# Patient Record
Sex: Female | Born: 1990 | Race: Black or African American | Hispanic: No | Marital: Single
Health system: Midwestern US, Community
[De-identification: ages and names within clinical notes are randomized; demographics above are authoritative.]

## PROBLEM LIST (undated history)

## (undated) DIAGNOSIS — Z789 Other specified health status: Secondary | ICD-10-CM

## (undated) DIAGNOSIS — E079 Disorder of thyroid, unspecified: Secondary | ICD-10-CM

## (undated) DIAGNOSIS — F99 Mental disorder, not otherwise specified: Secondary | ICD-10-CM

## (undated) DIAGNOSIS — R1900 Intra-abdominal and pelvic swelling, mass and lump, unspecified site: Secondary | ICD-10-CM

## (undated) HISTORY — PX: EYE SURGERY: SHX253

---

## 2014-05-09 ENCOUNTER — Encounter (HOSPITAL_COMMUNITY): Payer: Self-pay | Admitting: *Deleted

## 2014-05-09 ENCOUNTER — Emergency Department (HOSPITAL_COMMUNITY)
Admission: EM | Admit: 2014-05-09 | Discharge: 2014-05-09 | Disposition: A | Discharge status: Home / Self Care | Payer: Medicaid Other | Attending: Emergency Medicine | Admitting: Emergency Medicine

## 2014-05-09 DIAGNOSIS — Z720 Tobacco use: Secondary | ICD-10-CM | POA: Diagnosis not present

## 2014-05-09 DIAGNOSIS — Z3202 Encounter for pregnancy test, result negative: Secondary | ICD-10-CM | POA: Diagnosis not present

## 2014-05-09 DIAGNOSIS — F1510 Other stimulant abuse, uncomplicated: Secondary | ICD-10-CM | POA: Diagnosis present

## 2014-05-09 DIAGNOSIS — F329 Major depressive disorder, single episode, unspecified: Secondary | ICD-10-CM | POA: Insufficient documentation

## 2014-05-09 DIAGNOSIS — Z79899 Other long term (current) drug therapy: Secondary | ICD-10-CM | POA: Diagnosis not present

## 2014-05-09 DIAGNOSIS — F1210 Cannabis abuse, uncomplicated: Secondary | ICD-10-CM | POA: Insufficient documentation

## 2014-05-09 DIAGNOSIS — F1410 Cocaine abuse, uncomplicated: Secondary | ICD-10-CM | POA: Insufficient documentation

## 2014-05-09 DIAGNOSIS — F32A Depression, unspecified: Secondary | ICD-10-CM

## 2014-05-09 LAB — URINE MICROSCOPIC-ADD ON

## 2014-05-09 LAB — RAPID URINE DRUG SCREEN, HOSP PERFORMED
Amphetamines: POSITIVE — AB
BARBITURATES: NOT DETECTED
Benzodiazepines: NOT DETECTED
Cocaine: POSITIVE — AB
Opiates: NOT DETECTED
Tetrahydrocannabinol: POSITIVE — AB

## 2014-05-09 LAB — URINALYSIS, ROUTINE W REFLEX MICROSCOPIC
Glucose, UA: NEGATIVE mg/dL
NITRITE: NEGATIVE
PROTEIN: 100 mg/dL — AB
Specific Gravity, Urine: 1.043 — ABNORMAL HIGH (ref 1.005–1.030)
UROBILINOGEN UA: 1 mg/dL (ref 0.0–1.0)
pH: 5.5 (ref 5.0–8.0)

## 2014-05-09 LAB — CBC WITH DIFFERENTIAL/PLATELET
BASOS ABS: 0 10*3/uL (ref 0.0–0.1)
Basophils Relative: 0 % (ref 0–1)
EOS PCT: 0 % (ref 0–5)
Eosinophils Absolute: 0 10*3/uL (ref 0.0–0.7)
HEMATOCRIT: 41.8 % (ref 36.0–46.0)
Hemoglobin: 13.7 g/dL (ref 12.0–15.0)
Lymphocytes Relative: 12 % (ref 12–46)
Lymphs Abs: 1.4 10*3/uL (ref 0.7–4.0)
MCH: 27.7 pg (ref 26.0–34.0)
MCHC: 32.8 g/dL (ref 30.0–36.0)
MCV: 84.4 fL (ref 78.0–100.0)
MONO ABS: 0.6 10*3/uL (ref 0.1–1.0)
Monocytes Relative: 5 % (ref 3–12)
Neutro Abs: 9.3 10*3/uL — ABNORMAL HIGH (ref 1.7–7.7)
Neutrophils Relative %: 83 % — ABNORMAL HIGH (ref 43–77)
Platelets: 302 10*3/uL (ref 150–400)
RBC: 4.95 MIL/uL (ref 3.87–5.11)
RDW: 13.6 % (ref 11.5–15.5)
WBC: 11.3 10*3/uL — ABNORMAL HIGH (ref 4.0–10.5)

## 2014-05-09 LAB — COMPREHENSIVE METABOLIC PANEL
ALT: 11 U/L (ref 0–35)
AST: 16 U/L (ref 0–37)
Albumin: 4.5 g/dL (ref 3.5–5.2)
Alkaline Phosphatase: 55 U/L (ref 39–117)
Anion gap: 14 (ref 5–15)
BUN: 13 mg/dL (ref 6–23)
CALCIUM: 9.3 mg/dL (ref 8.4–10.5)
CO2: 24 mEq/L (ref 19–32)
CREATININE: 0.9 mg/dL (ref 0.50–1.10)
Chloride: 99 mEq/L (ref 96–112)
GFR calc Af Amer: >90 mL/min (ref >90)
GFR, EST NON AFRICAN AMERICAN: 89 mL/min — AB (ref >90)
Glucose, Bld: 95 mg/dL (ref 70–99)
Potassium: 3.7 mEq/L (ref 3.7–5.3)
Sodium: 137 mEq/L (ref 137–147)
Total Bilirubin: 0.3 mg/dL (ref 0.3–1.2)
Total Protein: 8.4 g/dL — ABNORMAL HIGH (ref 6.0–8.3)

## 2014-05-09 LAB — LIPASE, BLOOD: LIPASE: 16 U/L (ref 11–59)

## 2014-05-09 LAB — PREGNANCY, URINE: Preg Test, Ur: NEGATIVE

## 2014-05-09 MED ORDER — LORAZEPAM 1 MG PO TABS
1.0000 mg | ORAL_TABLET | Freq: Once | ORAL | Status: AC
  Administered 2014-05-09: 1 mg via ORAL
  Filled 2014-05-09: qty 1

## 2014-05-09 MED ORDER — ALPRAZOLAM 0.5 MG PO TABS: 0.5000 mg | ORAL_TABLET | Freq: Two times a day (BID) | ORAL | Status: DC | PRN

## 2014-05-09 MED ORDER — GI COCKTAIL ~~LOC~~
30.0000 mL | Freq: Once | ORAL | Status: AC
  Administered 2014-05-09: 30 mL via ORAL
  Filled 2014-05-09: qty 30

## 2014-05-09 NOTE — Discharge Instructions (Signed)
°Emergency Department Resource Guide °1) Find a Doctor and Pay Out of Pocket °Although you won't have to find out who is covered by your insurance plan, it is a good idea to ask around and get recommendations. You will then need to call the office and see if the doctor you have chosen will accept you as a new patient and what types of options they offer for patients who are self-pay. Some doctors offer discounts or will set up payment plans for their patients who do not have insurance, but you will need to ask so you aren't surprised when you get to your appointment. ° °2) Contact Your Local Health Department °Not all health departments have doctors that can see patients for sick visits, but many do, so it is worth a call to see if yours does. If you don't know where your local health department is, you can check in your phone book. The CDC also has a tool to help you locate your state's health department, and many state websites also have listings of all of their local health departments. ° °3) Find a Walk-in Clinic °If your illness is not likely to be very severe or complicated, you may want to try a walk in clinic. These are popping up all over the country in pharmacies, drugstores, and shopping centers. They're usually staffed by nurse practitioners or physician assistants that have been trained to treat common illnesses and complaints. They're usually fairly quick and inexpensive. However, if you have serious medical issues or chronic medical problems, these are probably not your best option. ° °No Primary Care Doctor: °- Call [facility] at  832-8000 - they can help you locate a primary care doctor that  accepts your insurance, provides certain services, etc. °- Physician Referral Service- 1-800-533-3463 ° °Chronic Pain Problems: °Organization         Address  Phone   Notes  °[facility]  (336) 297-2271 Patients need to be referred by their primary care doctor.  ° °Medication  Assistance: °Organization         Address  Phone   Notes  °[place] [facility] [address] °[place], [place] [address] (336) 641-8030 --Must be a resident of [place] °-- Must have NO insurance coverage whatsoever (no Medicaid/ Medicare, etc.) °-- The pt. MUST have a primary care doctor that directs their care regularly and follows them in the community °  °[facility]  (866) 331-1348   °[employer]  (888) 892-1162   ° °Agencies that provide inexpensive medical care: °Organization         Address  Phone   Notes  °[facility]  (336) 832-8035   ° Internal Medicine    (336) 832-7272   °[facility] [address] °[place], [place] [address] (336) 832-4777   °[facility] [address], °[place] (336) 271-4999   °[facility]    (336) 373-0678   °[facility]    (336) 272-1050   °[facility] ° [address], [place] Phone:  (336) 832-4444, Fax:  (336) 832-4440 Hours of Operation:  9 am - 6 pm, M-F.  Also accepts Medicaid/Medicare and self-pay.  °[facility] ° [address], [place] Phone: (336) 832-3150, Fax: (336) 832-3151. Hours of Operation:  8:30 am - 5:30 pm, M-F.  Also accepts Medicaid and self-pay.  °[facility] [place] [address], [place] Phone: (336) 878-6027   °[facility] [address], [place], [place] (336)723-1848, Ext. 123 Mondays & Thursdays: 7-9 AM.  First 15 patients are seen on a first come, first serve basis. °  ° °Medicaid-accepting [place] Providers: ° °Organization         Address  Phone   Notes  °[facility] [address], [place] (336) 641-2100 Also accepts self-pay patients.  °[facility] [address], [place] ° (336) 856-9996   °[facility] [address], [place]  (336) 288-8857   °[facility] [address] [place] [address], [place] (336) 299-7000   °Veita Bland [address], [place]  ° (336) 373-1557 Only accepts [place] Medicaid patients after they have their name applied to their card.  ° °Self-Pay (no insurance) in [place]: ° °Organization         Address  Phone   Notes  °Sickle Cell Patients, [facility] [address], [place] (336) 832-1970   °[facility] [address], [place] (336) 832-4400   °[facility] ° [address], [place] (336) 992-4800   °[facility]/Dr. Osei-Bonsu ° [address] [place] [address], [place] or [address], [place] (336) 841-8500 Phone number for both [place] and [place] locations is the same.  °[facility] [address], [place] (336) 299-0000   °[facility] [address] [place] [address], [place] or [address] (336) 852-7530 °(336) 878-2260   °[facility] [address], [place] (336) 350-1642, phone; (336) 294-5005, fax Sees patients 1st and 3rd Saturday of every month.  Must not qualify for public or private insurance (i.e. Medicaid, Medicare, [place] Health Choice, Veterans' Benefits) • Household income should be no more than 200% of the poverty level •The clinic cannot treat you if you are pregnant or think you are pregnant • Sexually transmitted diseases are not treated at the clinic.  ° ° °Dental Care: °Organization         Address  Phone  Notes  °[place] [facility] [address], [place] (336) 641-6152 Accepts children up to age 21 who are enrolled in Medicaid or [place] Health Choice; pregnant women with a Medicaid card; and children who have applied for Medicaid or [place] Health Choice, but were declined, whose parents can pay a reduced fee at time of service.  °[place]  [facility] [place]  [address], [place] (336) 641-7733 Accepts children up to age 21 who are enrolled in Medicaid or [place] Health Choice; pregnant women with a Medicaid card; and children who have applied for Medicaid or  Health Choice, but were declined, whose parents can pay a reduced fee at time of service.  °[facility] ° [address], [place] (336) 641-4533 Patients are seen by appointment only. Walk-ins are not accepted. [facility] will see patients 18 years of age and older. °Monday - Tuesday (8am-5pm) °Most Wednesdays (8:30-5pm) °$30 per visit, cash only  °[facility] ° [address], [place] (336) 641-4533 Patients are seen by appointment only. Walk-ins are not accepted. [facility] will see patients 18 years of age and older. °One   Wednesday Evening (Monthly: Volunteer Based).  $30 per visit, cash only  °[facility]  (919) 537-3737 for adults; Children under age 4, call Graduate Pediatric Dentistry at (919) [phone]. Children aged 4-14, please call (919) [phone] to request a pediatric application. ° Dental services are provided in all areas of dental care including fillings, crowns and bridges, complete and partial dentures, implants, gum treatment, root canals, and extractions. Preventive care is also provided. Treatment is provided to both adults and children. °Patients are selected via a lottery and there is often a waiting list. °  °[facility] [address], °[place] ° (336) 763-8833 [email or web address] °  °[facility] [address], [place], [place] (336)723-1848, Ext. 123 Second and Fourth Thursday of each month, opens at 6:30 AM; Clinic ends at 9 AM.  Patients are seen on a first-come first-served basis, and a limited number are seen during each clinic.  ° °[facility] ° [address], [place], [place] (336) 723-7904    Eligibility Requirements °You must have lived in [place], [place], or [place] counties for at least the last three months. °  You cannot be eligible for state or federal sponsored healthcare insurance, including Veterans Administration, Medicaid, or Medicare. °  You generally cannot be eligible for healthcare insurance through your employer.  °  How to apply: °Eligibility screenings are held every Tuesday and Wednesday afternoon from 1:00 pm until 4:00 pm. You do not need an appointment for the interview!  °[facility] [address], [place], [place] 336-631-2330   °[facility]  336-342-8273   °[facility]  336-703-3100   °[facility]  336-570-6415   ° °Behavioral Health Resources in the Community: °Intensive Outpatient Programs °Organization         Address  Phone  Notes  °[facility] [address], [place], [place] 336-878-6098   °[facility] [address], [place], [place] 336-832-9800   °[facility] [address], [place], [place] ° 336-882-2125   °[facility] [address],  °[place], [place] 1-800-853-5163 or 336-641-4981   °Substance Abuse Resources °Organization         Address  Phone  Notes  °[facility]  336-882-2125   °[facility]  336-784-9470   °[facility]  336-285-9073   °[facility]  336-845-3988   °Residential & Outpatient Substance Abuse Program  1-800-659-3381   °Psychological Services °Organization         Address  Phone  Notes  °[facility]  336- 832-9600   °[facility]  336- 378-7881   °[facility] [address], [place] 1-800-853-5163 or 336-641-4981   ° °Mobile Crisis Teams °Organization         Address  Phone  Notes  °[facility], Mobile Crisis Care Unit  1-877-626-1772   °Assertive °[facility] ° [address]  [place], [place] 336-834-9664   °Sharon DeEsch [address] °[place]  336-554-5454   ° °Self-Help/Support Groups °Organization         Address  Phone             Notes  °[facility]  - variety of support groups  336- 373-1402 Call for more information  °Narcotics Anonymous (NA), [facility] [address], °[place]   2 meetings at this location  ° °  Residential Treatment Programs °Organization         Address  Phone  Notes  °[facility] [address],    °[place]  1-866-801-8205   °[facility] ° [address], [place], [place] 704-293-8524   °[facility] [address], [place] 336-845-3988 Admissions: 8am-3pm M-F  °[facility] [address]    °[place], [place] 336-841-1104   °[facility] [address], [place], [place] 336-379-7146   °[facility] [address]  °[place], [place] 336-285-9073   °[facility] - Intensive Outpatient [address], [place], [place] 336-852-3033   °[facility] [address]  °[place], [place] 1-877-615-2722 or 336-784-9470   °[facility] [address] [place], [place] 336-227-7417 Accepts Medicaid  °[facility] [address]  °[place] 1-800-659-3381 Substance Abuse/Addiction Treatment  ° °[place] Behavioral Health Resources °Organization         Address  Phone  Notes  °[facility]  (888) 581-9988   °Julie Brannon, PhD [address] [place], [place]   (336) 349-5553 or (336) 951-0000   °[facility]   [address] °[place], [place] (336) 349-4454   °[facility] [address], [place], [place] (336) 342-8316 Insurance/Medicaid/sponsorship through [facility]  °[facility] [address]                                    [place], [place] (336) 342-8316 Therapy/tele-psych/case    °[facility] [address]  ° [place], [place] (336) 349-2233    °Dr. Arfeen  (336) 349-4544   °[facility] [place]  [employer] [place] [facility] 1) [address], [place] °2) [address], [place] °3)  [address], [place] (336) 349-3220 °(336) 342-7768 ° °(336) 342-8140   °[place] [facility] (336) 342-1394 or (336) 342-3537 (After Hours)    ° ° °

## 2014-05-09 NOTE — ED Provider Notes (Signed)
CSN: [id number]     Arrival date & time 05/09/14  1656 History   First MD Initiated Contact with Patient 05/09/14 1756     Chief Complaint  Patient presents with  . Drug Problem     (Consider location/radiation/quality/duration/timing/severity/associated sxs/prior Treatment) HPI Comments: 23 year old female with a history of bipolar disorder, she reports that she has been out of her medications for approximately 3 years, she moved here 4 months ago and since then has found friends who she believes are bad influences on her and had her use cocaine several days ago. She reports that she felt particularly bad afterwards and a friend told her that it was actually crystal methamphetamine and not cocaine. She only used one time, it was greater than 72 hours ago, she has had no vomiting, diarrhea, headaches or fevers, she does report shortness of breath today, feeling "jittery", anxious and having a burning in her epigastric abdomen. She also reports a strong smell to her urine. She denies any other drug use, denies any depression or suicidal thoughts, and believes that she needs to be back on her medications due to her questionable choices including the recent drug use. She has no other history of drug use per her report.  Patient is a 23 y.o. female presenting with drug problem. The history is provided by the patient.  Drug Problem    History reviewed. No pertinent past medical history. History reviewed. No pertinent past surgical history. History reviewed. No pertinent family history. History  Substance Use Topics  . Smoking status: Current Every Day Smoker    Types: Cigarettes  . Smokeless tobacco: Not on file  . Alcohol Use: No   OB History    No data available     Review of Systems  All other systems reviewed and are negative.     Allergies  Review of patient's allergies indicates no known allergies.  Home Medications   Prior to Admission medications   Medication Sig Start  Date End Date Taking? Authorizing Provider  Acetaminophen-Caff-Pyrilamine (MIDOL COMPLETE PO) Take 2 tablets by mouth daily as needed (pain).   Yes Historical Provider, MD  ibuprofen (ADVIL,MOTRIN) 200 MG tablet Take 600 mg by mouth every 6 (six) hours as needed (pain).   Yes Historical Provider, MD  ALPRAZolam Prudy Feeler(XANAX) 0.5 MG tablet Take 1 tablet (0.5 mg total) by mouth 2 (two) times daily as needed for anxiety. 05/09/14   Vida RollerBrian D Etana Beets, MD   BP 120/59 mmHg  Pulse 75  Temp(Src) 98 F (36.7 C)  Resp 18  Ht 5\' 1"  (1.549 m)  Wt 154 lb (69.854 kg)  BMI 29.11 kg/m2  SpO2 100%  LMP 05/09/2014 Physical Exam  Constitutional: She appears well-developed and well-nourished. No distress.  HENT:  Head: Normocephalic and atraumatic.  Mouth/Throat: Oropharynx is clear and moist. No oropharyngeal exudate.  Eyes: Conjunctivae and EOM are normal. Pupils are equal, round, and reactive to light. Right eye exhibits no discharge. Left eye exhibits no discharge. No scleral icterus.  Neck: Normal range of motion. Neck supple. No JVD present. No thyromegaly present.  Cardiovascular: Normal rate, regular rhythm, normal heart sounds and intact distal pulses.  Exam reveals no gallop and no friction rub.   No murmur heard. Pulmonary/Chest: Effort normal and breath sounds normal. No respiratory distress. She has no wheezes. She has no rales.  Abdominal: Soft. Bowel sounds are normal. She exhibits no distension and no mass. There is no tenderness.  Musculoskeletal: Normal range of motion. She exhibits no  edema or tenderness.  Lymphadenopathy:    She has no cervical adenopathy.  Neurological: She is alert. Coordination normal.  Skin: Skin is warm and dry. No rash noted. No erythema.  Psychiatric: She has a normal mood and affect. Her behavior is normal.  No suicidal thoughts, flight of ideas, tangential thoughts, no pressured speech.  Nursing note and vitals reviewed.   ED Course  Procedures (including critical  care time) Labs Review Labs Reviewed  CBC WITH DIFFERENTIAL - Abnormal; Notable for the following:    WBC 11.3 (*)    Neutrophils Relative % 83 (*)    Neutro Abs 9.3 (*)    All other components within normal limits  COMPREHENSIVE METABOLIC PANEL - Abnormal; Notable for the following:    Total Protein 8.4 (*)    GFR calc non Af Amer 89 (*)    All other components within normal limits  URINALYSIS, ROUTINE W REFLEX MICROSCOPIC - Abnormal; Notable for the following:    Color, Urine RED (*)    APPearance CLOUDY (*)    Specific Gravity, Urine 1.043 (*)    Hgb urine dipstick LARGE (*)    Bilirubin Urine MODERATE (*)    Ketones, ur >80 (*)    Protein, ur 100 (*)    Leukocytes, UA SMALL (*)    All other components within normal limits  URINE RAPID DRUG SCREEN (HOSP PERFORMED) - Abnormal; Notable for the following:    Cocaine POSITIVE (*)    Amphetamines POSITIVE (*)    Tetrahydrocannabinol POSITIVE (*)    All other components within normal limits  URINE MICROSCOPIC-ADD ON - Abnormal; Notable for the following:    Squamous Epithelial / LPF FEW (*)    Bacteria, UA MANY (*)    All other components within normal limits  LIPASE, BLOOD  PREGNANCY, URINE    Imaging Review No results found.   EKG Interpretation   Date/Time:  Sunday May 09 2014 17:21:32 EST Ventricular Rate:  84 PR Interval:  114 QRS Duration: 72 QT Interval:  358 QTC Calculation: 423 R Axis:   77 Text Interpretation:  Sinus rhythm with marked sinus arrhythmia  Nonspecific ST abnormality Abnormal ECG No old tracing to compare  Confirmed by Deyon Chizek  MD, Cara Aguino ([id number]) on 05/09/2014 5:57:16 PM      MDM   Final diagnoses:  Depression  Methamphetamine use    The patient has no obvious tremor, her vital signs are normal, she has several vague complaints likely related to the anxiety of feeling like her mom is going to find out that she use drugs, will check psychiatric labs, give the patient follow-up  information for local doctors in the community, 1 mg of Ativan by mouth to help with the patient's significant anxiety.  Labs confirm likely meth use X 1 - VS normal - improved after ativan - endorses mild depression - wants f/u in community - Product/process development scientistmonarch and resource list given.  Meds given in ED:  Medications  gi cocktail (Maalox,Lidocaine,Donnatal) (30 mLs Oral Given 05/09/14 1836)  LORazepam (ATIVAN) tablet 1 mg (1 mg Oral Given 05/09/14 1836)    New Prescriptions   ALPRAZOLAM (XANAX) 0.5 MG TABLET    Take 1 tablet (0.5 mg total) by mouth 2 (two) times daily as needed for anxiety.      Vida RollerBrian D Laquisha Northcraft, MD 05/09/14 2012

## 2014-05-09 NOTE — ED Notes (Signed)
Pt ambulated to bathroom. Steady gait

## 2014-05-09 NOTE — ED Notes (Signed)
Pt reports that she thought she was doing cocaine 3 days ago and actually did crystal meth and has been feeling bad ever since. Has multiple complaints. Reports tingling sensation to body, pain to body, dizziness, headache and dry mouth.

## 2014-08-21 ENCOUNTER — Emergency Department (HOSPITAL_COMMUNITY)
Admission: EM | Admit: 2014-08-21 | Discharge: 2014-08-21 | Disposition: A | Discharge status: Home / Self Care | Payer: Medicaid Other | Attending: Emergency Medicine | Admitting: Emergency Medicine

## 2014-08-21 ENCOUNTER — Encounter (HOSPITAL_COMMUNITY): Payer: Self-pay | Admitting: *Deleted

## 2014-08-21 DIAGNOSIS — Z3202 Encounter for pregnancy test, result negative: Secondary | ICD-10-CM | POA: Diagnosis not present

## 2014-08-21 DIAGNOSIS — Z720 Tobacco use: Secondary | ICD-10-CM | POA: Insufficient documentation

## 2014-08-21 DIAGNOSIS — K529 Noninfective gastroenteritis and colitis, unspecified: Secondary | ICD-10-CM

## 2014-08-21 DIAGNOSIS — Z79899 Other long term (current) drug therapy: Secondary | ICD-10-CM | POA: Diagnosis not present

## 2014-08-21 DIAGNOSIS — Z3200 Encounter for pregnancy test, result unknown: Secondary | ICD-10-CM | POA: Diagnosis present

## 2014-08-21 NOTE — Discharge Instructions (Signed)
Your urine pregnancy test was negative today. However, as we discussed this does not exclude very early pregnancy. If you still have not had your period in 2 weeks, recommend repeat pregnancy test. Keep your follow-up appointment with you regular Dr. on Monday. Would not start birth control pills until you have your next period or cleared by your regular physician. For your symptoms of stomach virus, drinks bony of fluids. Gatorade and Powerade are good options. Avoid heavy or fried fatty foods through the weekend and consume a bland diet. Return for persistent vomiting with inability to keep down fluids, abdominal pain or new concerns.

## 2014-08-21 NOTE — ED Notes (Signed)
Pt here with her child.  Pt states "I've taken 2 tests already and they are negative."  She wants another test because she is nauseated and has not had her period in over a month.

## 2014-08-21 NOTE — ED Provider Notes (Signed)
CSN: [id number]     Arrival date & time 08/21/14  1018 History   First MD Initiated Contact with Patient 08/21/14 1153     Chief Complaint  Patient presents with  . Possible Pregnancy  . Back Pain  . Abdominal Pain  . feels faint      (Consider location/radiation/quality/duration/timing/severity/associated sxs/prior Treatment) HPI Comments: 24 year old female with no chronic medical conditions here with her daughter. Daughter presented to PED for evaluation of sore throat.  While mother here w/ her daughter, she requests pregnancy test.  She is would like to start OCPs but she is late for her period (LMP 5-6 weeks ago) and so was worried she may be pregnanct. She has had some nausea w/ vomiting and more frequent loose stools over the past 2 days. No fevers. No dysuria. No urgency. NO abdominal pain. No vaginal discharge.  The history is provided by the patient.    History reviewed. No pertinent past medical history. History reviewed. No pertinent past surgical history. No family history on file. History  Substance Use Topics  . Smoking status: Current Every Day Smoker    Types: Cigarettes  . Smokeless tobacco: Not on file  . Alcohol Use: No   OB History    No data available     Review of Systems  10 systems were reviewed and were negative except as stated in the HPI   Allergies  Review of patient's allergies indicates no known allergies.  Home Medications   Prior to Admission medications   Medication Sig Start Date End Date Taking? Authorizing Provider  FLUoxetine (PROZAC) 40 MG capsule Take 40 mg by mouth daily.   Yes Historical Provider, MD  traMADol (ULTRAM-ER) 200 MG 24 hr tablet Take 200 mg by mouth daily.   Yes Historical Provider, MD  Acetaminophen-Caff-Pyrilamine (MIDOL COMPLETE PO) Take 2 tablets by mouth daily as needed (pain).    Historical Provider, MD  ALPRAZolam Prudy Feeler(XANAX) 0.5 MG tablet Take 1 tablet (0.5 mg total) by mouth 2 (two) times daily as needed for  anxiety. 05/09/14   Eber HongBrian Miller, MD  ibuprofen (ADVIL,MOTRIN) 200 MG tablet Take 600 mg by mouth every 6 (six) hours as needed (pain).    Historical Provider, MD   BP 127/73 mmHg  Pulse 83  Temp(Src) 98.6 F (37 C) (Oral)  Resp 22  SpO2 98%  LMP 07/08/2014 Physical Exam  Constitutional: She is oriented to person, place, and time. She appears well-developed and well-nourished. No distress.  HENT:  Head: Normocephalic and atraumatic.  Mouth/Throat: No oropharyngeal exudate.  Eyes: Conjunctivae and EOM are normal. Pupils are equal, round, and reactive to light.  Neck: Normal range of motion. Neck supple.  Cardiovascular: Normal rate, regular rhythm and normal heart sounds.  Exam reveals no gallop and no friction rub.   No murmur heard. Pulmonary/Chest: Effort normal. No respiratory distress. She has no wheezes. She has no rales.  Abdominal: Soft. Bowel sounds are normal. She exhibits no distension. There is no tenderness. There is no rebound and no guarding.  Musculoskeletal: Normal range of motion. She exhibits no tenderness.  Neurological: She is alert and oriented to person, place, and time. No cranial nerve deficit.  Normal strength 5/5 in upper and lower extremities, normal coordination  Skin: Skin is warm and dry. No rash noted.  Psychiatric: She has a normal mood and affect.  Nursing note and vitals reviewed.   ED Course  Procedures (including critical care time) Labs Review Labs Reviewed  POC  URINE PREG, ED  POC URINE PREG, ED   POC pregnancy test negative  Imaging Review No results found.   EKG Interpretation None      MDM   24 year old female, mother of child being seen in pediatric ED, checking in to request pregnancy test. She is worried she may be pregnant as LMP was 5-6 weeks ago. Also with N/V and loose stools for 2 days. No fever. No abdominal pain. No dysuria. No vaginal d/c. Abdomen soft and NT on exam. Upreg is neg. Will advise supportive care for viral  GE; she has PCP follow up on Monday. Return precautions as outlined in the d/c instructions.     Ree Shay, MD 08/21/14 2228

## 2014-08-21 NOTE — ED Notes (Addendum)
POC Preg test resulted in NEGATIVE    DJS

## 2014-08-23 LAB — POC URINE PREG, ED: Preg Test, Ur: NEGATIVE

## 2014-12-06 ENCOUNTER — Emergency Department (HOSPITAL_COMMUNITY)
Admission: EM | Admit: 2014-12-06 | Discharge: 2014-12-06 | Disposition: A | Discharge status: Home / Self Care | Payer: Medicaid Other | Attending: Emergency Medicine | Admitting: Emergency Medicine

## 2014-12-06 ENCOUNTER — Emergency Department (HOSPITAL_COMMUNITY): Payer: Medicaid Other

## 2014-12-06 ENCOUNTER — Encounter (HOSPITAL_COMMUNITY): Payer: Self-pay | Admitting: Emergency Medicine

## 2014-12-06 DIAGNOSIS — R42 Dizziness and giddiness: Secondary | ICD-10-CM | POA: Diagnosis not present

## 2014-12-06 DIAGNOSIS — R1013 Epigastric pain: Secondary | ICD-10-CM

## 2014-12-06 DIAGNOSIS — Z720 Tobacco use: Secondary | ICD-10-CM | POA: Diagnosis not present

## 2014-12-06 DIAGNOSIS — K5900 Constipation, unspecified: Secondary | ICD-10-CM | POA: Insufficient documentation

## 2014-12-06 DIAGNOSIS — Z8639 Personal history of other endocrine, nutritional and metabolic disease: Secondary | ICD-10-CM | POA: Diagnosis not present

## 2014-12-06 DIAGNOSIS — Z79899 Other long term (current) drug therapy: Secondary | ICD-10-CM | POA: Diagnosis not present

## 2014-12-06 DIAGNOSIS — Z3202 Encounter for pregnancy test, result negative: Secondary | ICD-10-CM | POA: Diagnosis not present

## 2014-12-06 DIAGNOSIS — R112 Nausea with vomiting, unspecified: Secondary | ICD-10-CM

## 2014-12-06 DIAGNOSIS — R079 Chest pain, unspecified: Secondary | ICD-10-CM | POA: Diagnosis not present

## 2014-12-06 DIAGNOSIS — R1011 Right upper quadrant pain: Secondary | ICD-10-CM | POA: Diagnosis present

## 2014-12-06 DIAGNOSIS — K2970 Gastritis, unspecified, without bleeding: Secondary | ICD-10-CM | POA: Diagnosis not present

## 2014-12-06 HISTORY — DX: Disorder of thyroid, unspecified: E07.9

## 2014-12-06 LAB — COMPREHENSIVE METABOLIC PANEL
ALT: 11 U/L — ABNORMAL LOW (ref 14–54)
ANION GAP: 8 (ref 5–15)
AST: 15 U/L (ref 15–41)
Albumin: 3.6 g/dL (ref 3.5–5.0)
Alkaline Phosphatase: 44 U/L (ref 38–126)
BILIRUBIN TOTAL: 0.4 mg/dL (ref 0.3–1.2)
BUN: 13 mg/dL (ref 6–20)
CO2: 25 mmol/L (ref 22–32)
Calcium: 9 mg/dL (ref 8.9–10.3)
Chloride: 104 mmol/L (ref 101–111)
Creatinine, Ser: 0.96 mg/dL (ref 0.44–1.00)
GFR calc Af Amer: >60 mL/min (ref >60)
GFR calc non Af Amer: >60 mL/min (ref >60)
GLUCOSE: 109 mg/dL — AB (ref 65–99)
Potassium: 4.6 mmol/L (ref 3.5–5.1)
SODIUM: 137 mmol/L (ref 135–145)
Total Protein: 6.8 g/dL (ref 6.5–8.1)

## 2014-12-06 LAB — CBC WITH DIFFERENTIAL/PLATELET
Basophils Absolute: 0.1 10*3/uL (ref 0.0–0.1)
Basophils Relative: 1 % (ref 0–1)
Eosinophils Absolute: 0.4 10*3/uL (ref 0.0–0.7)
Eosinophils Relative: 4 % (ref 0–5)
HCT: 42.1 % (ref 36.0–46.0)
Hemoglobin: 13.8 g/dL (ref 12.0–15.0)
Lymphocytes Relative: 23 % (ref 12–46)
Lymphs Abs: 2.5 10*3/uL (ref 0.7–4.0)
MCH: 28.1 pg (ref 26.0–34.0)
MCHC: 32.8 g/dL (ref 30.0–36.0)
MCV: 85.7 fL (ref 78.0–100.0)
Monocytes Absolute: 0.7 10*3/uL (ref 0.1–1.0)
Monocytes Relative: 7 % (ref 3–12)
NEUTROS ABS: 7 10*3/uL (ref 1.7–7.7)
Neutrophils Relative %: 65 % (ref 43–77)
PLATELETS: 258 10*3/uL (ref 150–400)
RBC: 4.91 MIL/uL (ref 3.87–5.11)
RDW: 14.2 % (ref 11.5–15.5)
WBC: 10.6 10*3/uL — ABNORMAL HIGH (ref 4.0–10.5)

## 2014-12-06 LAB — POC URINE PREG, ED: Preg Test, Ur: NEGATIVE

## 2014-12-06 LAB — I-STAT TROPONIN, ED
Troponin i, poc: 0 ng/mL (ref 0.00–0.08)
Troponin i, poc: 0 ng/mL (ref 0.00–0.08)

## 2014-12-06 LAB — LIPASE, BLOOD: Lipase: 24 U/L (ref 22–51)

## 2014-12-06 MED ORDER — OMEPRAZOLE 20 MG PO CPDR: 20.0000 mg | DELAYED_RELEASE_CAPSULE | Freq: Every day | ORAL | Status: DC

## 2014-12-06 MED ORDER — GI COCKTAIL ~~LOC~~
30.0000 mL | Freq: Once | ORAL | Status: AC
  Administered 2014-12-06: 30 mL via ORAL
  Filled 2014-12-06: qty 30

## 2014-12-06 MED ORDER — ONDANSETRON HCL 4 MG/2ML IJ SOLN
4.0000 mg | Freq: Once | INTRAMUSCULAR | Status: AC
  Administered 2014-12-06: 4 mg via INTRAVENOUS
  Filled 2014-12-06: qty 2

## 2014-12-06 MED ORDER — POLYETHYLENE GLYCOL 3350 17 G PO PACK: 17.0000 g | PACK | Freq: Two times a day (BID) | ORAL | Status: DC

## 2014-12-06 MED ORDER — PANTOPRAZOLE SODIUM 40 MG IV SOLR
40.0000 mg | Freq: Once | INTRAVENOUS | Status: AC
  Administered 2014-12-06: 40 mg via INTRAVENOUS
  Filled 2014-12-06: qty 40

## 2014-12-06 MED ORDER — ONDANSETRON HCL 8 MG PO TABS: 8.0000 mg | ORAL_TABLET | Freq: Three times a day (TID) | ORAL | Status: DC | PRN

## 2014-12-06 MED ORDER — SODIUM CHLORIDE 0.9 % IV BOLUS (SEPSIS)
1000.0000 mL | Freq: Once | INTRAVENOUS | Status: AC
  Administered 2014-12-06: 1000 mL via INTRAVENOUS

## 2014-12-06 MED ORDER — MORPHINE SULFATE 4 MG/ML IJ SOLN
4.0000 mg | Freq: Once | INTRAMUSCULAR | Status: AC
  Administered 2014-12-06: 4 mg via INTRAVENOUS
  Filled 2014-12-06: qty 1

## 2014-12-06 NOTE — ED Notes (Signed)
Pt c/o epigastric pain onset at 0230 today. Pt smoked a cigarette and the pain increased. Pt went to sleep and woke up this morning with continued pain, nausea, lightheaded and sore throat. Pt tried "Banging" on her chest to make the pain go away. Pt c/o abdominal pain now.

## 2014-12-06 NOTE — ED Notes (Signed)
EKG and vitals performed

## 2014-12-06 NOTE — ED Provider Notes (Signed)
CSN: [id number]     Arrival date & time 12/06/14  1034 History   First MD Initiated Contact with Patient 12/06/14 1108     Chief Complaint  Patient presents with  . Chest Pain  . Abdominal Pain  . Nausea     (Consider location/radiation/quality/duration/timing/severity/associated sxs/prior Treatment) HPI Comments: Veronica Hinton is a 24 y.o. female with a PMHx of hypothyroidism, who presents to the ED with complaints of epigastric pain that began last night gradually around 2:30 AM. She puts the pain is 5/10 intermittent sharp radiating to her central chest and right upper quadrant, worse with laying on the right side, and improved with laying on her stomach. Associated symptoms include intermittent nausea without ongoing nausea, one episode of nonbloody nonbilious emesis this morning, intermittent diaphoresis and lightheadedness which are currently resolved, and a dry cough for the last 3 days. She has also not had a bowel movement in 2 days. She is a smoker, and she states that due to an increase in stress recently she has been smoking more frequently. She denies any fevers, chills, shortness of breath, wheezing, hemoptysis, leg swelling, recent travel/surgery/immobilization, estrogen use, active cancer, claudication, orthopnea, diarrhea, melena, hematochezia, obstipation, dysuria, hematuria, vaginal bleeding or discharge, numbness, tingling, weakness, back or neck pain. She denies any family history of cardiac disease, no personal history of cardiac disease. She reports frequent NSAID use for her headaches. She denies any alcohol use.  Patient is a 24 y.o. female presenting with chest pain and abdominal pain. The history is provided by the patient. No language interpreter was used.  Chest Pain Pain location:  Epigastric and substernal area Pain quality: sharp   Pain radiates to:  Epigastrium Pain radiates to the back: no   Pain severity:  Moderate Onset quality:  Gradual Duration:  9  hours Timing:  Intermittent Progression:  Unchanged Chronicity:  New Context: at rest   Relieved by:  Certain positions (laying on stomach) Worsened by:  Certain positions (laying on R side) Ineffective treatments:  None tried Associated symptoms: abdominal pain, cough (x3 days, dry), diaphoresis (intermittently), nausea (intermittently, none now) and vomiting (x1 episode this morning)   Associated symptoms: no back pain, no claudication, no fever, no headache, no lower extremity edema, no near-syncope, no numbness, no orthopnea, no PND, no shortness of breath and no weakness   Risk factors: smoking   Risk factors: no birth control, no coronary artery disease, no diabetes mellitus, no high cholesterol, no hypertension, no immobilization, no prior DVT/PE and no surgery   Abdominal Pain Associated symptoms: chest pain, constipation (no BM in 2 days), cough (x3 days, dry), nausea (intermittently, none now) and vomiting (x1 episode this morning)   Associated symptoms: no chills, no diarrhea, no dysuria, no fever, no hematuria, no shortness of breath, no vaginal bleeding and no vaginal discharge     Past Medical History  Diagnosis Date  . Thyroid disease    History reviewed. No pertinent past surgical history. No family history on file. History  Substance Use Topics  . Smoking status: Current Every Day Smoker    Types: Cigarettes  . Smokeless tobacco: Not on file  . Alcohol Use: No   OB History    No data available     Review of Systems  Constitutional: Positive for diaphoresis (intermittently). Negative for fever and chills.  Eyes: Negative for visual disturbance.  Respiratory: Positive for cough (x3 days, dry). Negative for shortness of breath and wheezing.   Cardiovascular: Positive for chest pain.  Negative for orthopnea, claudication, leg swelling, PND and near-syncope.  Gastrointestinal: Positive for nausea (intermittently, none now), vomiting (x1 episode this morning),  abdominal pain and constipation (no BM in 2 days). Negative for diarrhea and blood in stool.  Genitourinary: Negative for dysuria, hematuria, vaginal bleeding and vaginal discharge.  Musculoskeletal: Negative for myalgias, back pain, arthralgias and neck pain.  Skin: Negative for color change.  Allergic/Immunologic: Negative for immunocompromised state.  Neurological: Positive for light-headedness (intermittently, none ongoing). Negative for weakness, numbness and headaches.  Psychiatric/Behavioral: Negative for confusion.   10 Systems reviewed and are negative for acute change except as noted in the HPI.    Allergies  Review of patient's allergies indicates no known allergies.  Home Medications   Prior to Admission medications   Medication Sig Start Date End Date Taking? Authorizing Provider  Acetaminophen-Caff-Pyrilamine (MIDOL COMPLETE PO) Take 2 tablets by mouth daily as needed (pain).    Historical Provider, MD  ALPRAZolam Prudy Feeler) 0.5 MG tablet Take 1 tablet (0.5 mg total) by mouth 2 (two) times daily as needed for anxiety. 05/09/14   Eber Hong, MD  FLUoxetine (PROZAC) 40 MG capsule Take 40 mg by mouth daily.    Historical Provider, MD  ibuprofen (ADVIL,MOTRIN) 200 MG tablet Take 600 mg by mouth every 6 (six) hours as needed (pain).    Historical Provider, MD  traMADol (ULTRAM-ER) 200 MG 24 hr tablet Take 200 mg by mouth daily.    Historical Provider, MD   BP 133/83 mmHg  Pulse 72  Temp(Src) 98.1 F (36.7 C) (Oral)  Resp 15  Ht 5\' 1"  (1.549 m)  Wt 174 lb (78.926 kg)  BMI 32.89 kg/m2  SpO2 100%  LMP 11/08/2014 (Approximate) Physical Exam  Constitutional: She is oriented to person, place, and time. Vital signs are normal. She appears well-developed and well-nourished.  Non-toxic appearance. No distress.  Afebrile, nontoxic, NAD  HENT:  Head: Normocephalic and atraumatic.  Mouth/Throat: Oropharynx is clear and moist and mucous membranes are normal.  Eyes: Conjunctivae and  EOM are normal. Right eye exhibits no discharge. Left eye exhibits no discharge.  Neck: Normal range of motion. Neck supple. No JVD present.  Cardiovascular: Normal rate, regular rhythm, normal heart sounds and intact distal pulses.  Exam reveals no gallop and no friction rub.   No murmur heard. RRR, nl s1/s2, no m/r/g, distal pulses intact, no pedal edema   Pulmonary/Chest: Effort normal and breath sounds normal. No respiratory distress. She has no decreased breath sounds. She has no wheezes. She has no rhonchi. She has no rales. She exhibits tenderness. She exhibits no crepitus, no deformity and no retraction.    CTAB in all lung fields, no w/r/r, no hypoxia or increased WOB, speaking in full sentences, SpO2 100% on RA Chest wall mildly TTP anteriorly along central region into epigastrum, no crepitus, deformity, or retractions  Abdominal: Soft. Normal appearance and bowel sounds are normal. She exhibits no distension. There is tenderness in the right upper quadrant and epigastric area. There is positive Murphy's sign. There is no rigidity, no rebound, no guarding, no CVA tenderness and no tenderness at McBurney's point.    Soft, nondistended, +BS throughout, with moderate epigastric and RUQ TTP, no r/g/r, +murphy's, neg mcburney's, no CVA TTP   Musculoskeletal: Normal range of motion.  MAE x4 Strength and sensation grossly intact Distal pulses intact No pedal edema, neg homan's bilaterally   Neurological: She is alert and oriented to person, place, and time. She has normal strength. No sensory  deficit.  Skin: Skin is warm, dry and intact. No rash noted.  Psychiatric: She has a normal mood and affect.  Nursing note and vitals reviewed.   ED Course  Procedures (including critical care time)  12:59 Orthostatic Vital Signs BM  Orthostatic Lying  - BP- Lying: 114/58 mmHg ; Pulse- Lying: 58  Orthostatic Sitting - BP- Sitting: 125/66 mmHg ; Pulse- Sitting: 65  Orthostatic Standing at 0  minutes - BP- Standing at 0 minutes: 114/71 mmHg ; Pulse- Standing at 0 minutes: 80      Labs Review Labs Reviewed  COMPREHENSIVE METABOLIC PANEL - Abnormal; Notable for the following:    Glucose, Bld 109 (*)    ALT 11 (*)    All other components within normal limits  CBC WITH DIFFERENTIAL/PLATELET - Abnormal; Notable for the following:    WBC 10.6 (*)    All other components within normal limits  LIPASE, BLOOD  I-STAT TROPOININ, ED  POC URINE PREG, ED  I-STAT TROPOININ, ED    Imaging Review US Abdomen Complete  12/06/2014   CLINICAL DATA:  Acute chest pain, nausea, vomiting  EXAM: COMPLETE ABDOMINAL ULTRASOUND  FINDINGS: Gallbladder: Physiologically distended without stones, wall thickening, or pericholecystic fluid. Sonographer reports no sonographic Murphy's sign.  Common bile duct:  Normal in caliber, 1.74mm diameter.  Liver: Homogeneous in echotexture without focal lesion or intrahepatic bile duct dilatation.  IVC:  Negative  Pancreas:  Negative  Spleen:  No focal lesion, 5.2cm in length.  Right Kidney:  No mass or hydronephrosis, 11.2cm in length.  Left Kidney:  No lesion or hydronephrosis, 12.1cm in length.  Abdominal aorta:  Negative  IMPRESSION: Negative.  Normal gallbladder.   Electronically Signed   By: Corlis Leak M.D.   On: 12/06/2014 12:21   Dg Abd Acute W/chest  12/06/2014   CLINICAL DATA:  Epigastric pain since last night  EXAM: DG ABDOMEN ACUTE W/ 1V CHEST  COMPARISON:  None.  FINDINGS: Large stool burden throughout the colon. Nonobstructive bowel gas pattern. No free air organomegaly. No suspicious calcification.  Heart and mediastinal contours are within normal limits. No focal opacities or effusions. No acute bony abnormality.  IMPRESSION: Large stool burden. No obstruction or free air. No active cardiopulmonary disease.   Electronically Signed   By: Charlett Nose M.D.   On: 12/06/2014 12:27     EKG Interpretation   Date/Time:  Monday December 06 2014 10:47:28  EDT Ventricular Rate:  74 PR Interval:  116 QRS Duration: 77 QT Interval:  369 QTC Calculation: 409 R Axis:   61 Text Interpretation:  Sinus rhythm Borderline short PR interval No  significant change since last tracing Confirmed by Ethelda Chick  MD, SAM  [phone]) on 12/06/2014 10:59:04 AM      MDM   Final diagnoses:  RUQ abdominal pain  Epigastric abdominal pain  Chest pain  Constipation, unspecified constipation type  Non-intractable vomiting with nausea, vomiting of unspecified type  Orthostatic lightheadedness  Tobacco abuse  Gastritis    24 y.o. female here with epigastric pain radiating into central chest, intermittent nausea, one episode of vomiting, intermittent diaphoresis and lightheadedness all onset around 2:30am when she was smoking a cigarette. States she's been smoking more often lately because she's stressed. On exam, epigastrum and RUQ tenderness as well as central chest wall tenderness. No hypoxia, tachycardia, or LE swelling. Pt with few RFs, smoking is the only RF of cardiac disease. Will get CBC, CMP, lipase, Upreg, orthostatics, trop, EKG, acute abd series, and  abd u/s to eval for biliary etiology. Could be gastritis vs cholelithiasis vs costochondritis from her cough x3 days. Will give morphine, GI cocktail, and protonix. Will give fluids and reassess.  2:05 PM Upreg neg, trop neg, EKG unremarkable and unchanged, CMP WNL, lipase WNL, CBC w/diff unremarkable. Acute abd series showing large stool burden, which could be the source of her pain. U/S abdomen showing physiologically distended gallbladder without stones or wall thickening, no signs of cholecystitis. Orthostatic VS showing some increase in HR with going from laying to standing, could be cause of her lightheadedness. Pain and nausea improved. Since symptoms started ~9hrs PTA, will repeat trop now which is ~12hrs after onset, in order to r/o ACS with certainty. Will PO challenge now. Discussed that pt needs to  drink plenty of water, start miralax BID until she has soft daily BMs then adjust as needed, and increase fiber in diet. Will send home with prilosec as well. Discussed smoking cessation. Will give nausea meds for home. Will have her f/up with PCP in 1wk.   3:05 PM Second trop neg. Tolerating PO well. Will d/c with previously discussed plan. I explained the diagnosis and have given explicit precautions to return to the ER including for any other new or worsening symptoms. The patient understands and accepts the medical plan as it's been dictated and I have answered their questions. Discharge instructions concerning home care and prescriptions have been given. The patient is STABLE and is discharged to home in good condition.  BP 101/59 mmHg  Pulse 53  Temp(Src) 98.1 F (36.7 C) (Oral)  Resp 16  Ht 5\' 1"  (1.549 m)  Wt 174 lb (78.926 kg)  BMI 32.89 kg/m2  SpO2 100%  LMP 11/08/2014 (Approximate)  Meds ordered this encounter  Medications  . pantoprazole (PROTONIX) injection 40 mg    Sig:   . gi cocktail (Maalox,Lidocaine,Donnatal)    Sig:   . morphine 4 MG/ML injection 4 mg    Sig:   . sodium chloride 0.9 % bolus 1,000 mL    Sig:   . ondansetron (ZOFRAN) injection 4 mg    Sig:   . omeprazole (PRILOSEC) 20 MG capsule    Sig: Take 1 capsule (20 mg total) by mouth daily.    Dispense:  7 capsule    Refill:  0    Order Specific Question:  Supervising Provider    Answer:  MILLER, BRIAN [[id number]]  . polyethylene glycol (MIRALAX / GLYCOLAX) packet    Sig: Take 17 g by mouth 2 (two) times daily. Take 17g PO BID until daily soft stools achieved, then adjust as needed to maintain daily soft stools    Dispense:  24 each    Refill:  0    Order Specific Question:  Supervising Provider    Answer:  Hyacinth MeekerMILLER, BRIAN [[id number]]  . ondansetron (ZOFRAN) 8 MG tablet    Sig: Take 1 tablet (8 mg total) by mouth every 8 (eight) hours as needed for nausea or vomiting.    Dispense:  10 tablet    Refill:  0     Order Specific Question:  Supervising Provider    Answer:  Eber HongMILLER, BRIAN [[id number]]     Adilynn Bessey Camprubi-Soms, PA-C 12/06/14 1509  Doug SouSam Jacubowitz, MD 12/06/14 1737

## 2014-12-06 NOTE — Discharge Instructions (Signed)
Your abdominal pain and chest discomfort is likely caused by your constipation. You will need to start using miralax twice daily until you achieve daily soft bowel movements, then adjust accordingly in order to continue having daily soft bowel movements (for example, once daily or every other day). Use prilosec for the next week to help with your symptoms. Use zofran as needed for nausea. Drink plenty of WATER, and increase the fiber intake in your diet (see list below). Use tylenol or motrin as needed for pain. Follow up with your regular doctor in 1 week for recheck of symptoms. Return to the ER for changes or worsening symptoms.  Abdominal (belly) pain can be caused by many things. Your caregiver performed an examination and possibly ordered blood/urine tests and imaging (CT scan, x-rays, ultrasound). Many cases can be observed and treated at home after initial evaluation in the emergency department. Even though you are being discharged home, abdominal pain can be unpredictable. Therefore, you need a repeated exam if your pain does not resolve, returns, or worsens. Most patients with abdominal pain don't have to be admitted to the hospital or have surgery, but serious problems like appendicitis and gallbladder attacks can start out as nonspecific pain. Many abdominal conditions cannot be diagnosed in one visit, so follow-up evaluations are very important. SEEK IMMEDIATE MEDICAL ATTENTION IF YOU DEVELOP ANY OF THE FOLLOWING SYMPTOMS:  The pain does not go away or becomes severe.   A temperature above 101 develops.   Repeated vomiting occurs (multiple episodes).   The pain becomes localized to portions of the abdomen. The right side could possibly be appendicitis. In an adult, the left lower portion of the abdomen could be colitis or diverticulitis.   Blood is being passed in stools or vomit (bright red or black tarry stools).   Return also if you develop chest pain, difficulty breathing, dizziness  or fainting, or become confused, poorly responsive, or inconsolable (young children).  The constipation stays for more than 4 days.   There is belly (abdominal) or rectal pain.   You do not seem to be getting better.     Abdominal Pain, Women Abdominal (stomach, pelvic, or belly) pain can be caused by many things. It is important to tell your doctor:  The location of the pain.  Does it come and go or is it present all the time?  Are there things that start the pain (eating certain foods, exercise)?  Are there other symptoms associated with the pain (fever, nausea, vomiting, diarrhea)? All of this is helpful to know when trying to find the cause of the pain. CAUSES   Stomach: virus or bacteria infection, or ulcer.  Intestine: appendicitis (inflamed appendix), regional ileitis (Crohn's disease), ulcerative colitis (inflamed colon), irritable bowel syndrome, diverticulitis (inflamed diverticulum of the colon), or cancer of the stomach or intestine.  Gallbladder disease or stones in the gallbladder.  Kidney disease, kidney stones, or infection.  Pancreas infection or cancer.  Fibromyalgia (pain disorder).  Diseases of the female organs:  Uterus: fibroid (non-cancerous) tumors or infection.  Fallopian tubes: infection or tubal pregnancy.  Ovary: cysts or tumors.  Pelvic adhesions (scar tissue).  Endometriosis (uterus lining tissue growing in the pelvis and on the pelvic organs).  Pelvic congestion syndrome (female organs filling up with blood just before the menstrual period).  Pain with the menstrual period.  Pain with ovulation (producing an egg).  Pain with an IUD (intrauterine device, birth control) in the uterus.  Cancer of the female organs.  Functional pain (pain not caused by a disease, may improve without treatment).  Psychological pain.  Depression. DIAGNOSIS  Your doctor will decide the seriousness of your pain by doing an examination.  Blood  tests.  X-rays.  Ultrasound.  CT scan (computed tomography, special type of X-ray).  MRI (magnetic resonance imaging).  Cultures, for infection.  Barium enema (dye inserted in the large intestine, to better view it with X-rays).  Colonoscopy (looking in intestine with a lighted tube).  Laparoscopy (minor surgery, looking in abdomen with a lighted tube).  Major abdominal exploratory surgery (looking in abdomen with a large incision). TREATMENT  The treatment will depend on the cause of the pain.   Many cases can be observed and treated at home.  Over-the-counter medicines recommended by your caregiver.  Prescription medicine.  Antibiotics, for infection.  Birth control pills, for painful periods or for ovulation pain.  Hormone treatment, for endometriosis.  Nerve blocking injections.  Physical therapy.  Antidepressants.  Counseling with a psychologist or psychiatrist.  Minor or major surgery. HOME CARE INSTRUCTIONS   Do not take laxatives, unless directed by your caregiver.  Take over-the-counter pain medicine only if ordered by your caregiver. Do not take aspirin because it can cause an upset stomach or bleeding.  Try a clear liquid diet (broth or water) as ordered by your caregiver. Slowly move to a bland diet, as tolerated, if the pain is related to the stomach or intestine.  Have a thermometer and take your temperature several times a day, and record it.  Bed rest and sleep, if it helps the pain.  Avoid sexual intercourse, if it causes pain.  Avoid stressful situations.  Keep your follow-up appointments and tests, as your caregiver orders.  If the pain does not go away with medicine or surgery, you may try:  Acupuncture.  Relaxation exercises (yoga, meditation).  Group therapy.  Counseling. SEEK MEDICAL CARE IF:   You notice certain foods cause stomach pain.  Your home care treatment is not helping your pain.  You need stronger pain  medicine.  You want your IUD removed.  You feel faint or lightheaded.  You develop nausea and vomiting.  You develop a rash.  You are having side effects or an allergy to your medicine. SEEK IMMEDIATE MEDICAL CARE IF:   Your pain does not go away or gets worse.  You have a fever.  Your pain is felt only in portions of the abdomen. The right side could possibly be appendicitis. The left lower portion of the abdomen could be colitis or diverticulitis.  You are passing blood in your stools (bright red or black tarry stools, with or without vomiting).  You have blood in your urine.  You develop chills, with or without a fever.  You pass out. MAKE SURE YOU:   Understand these instructions.  Will watch your condition.  Will get help right away if you are not doing well or get worse. Document Released: 03/18/2007 Document Revised: 10/05/2013 Document Reviewed: 04/07/2009 [facility] Patient Information 2015 [place], [place]. This information is not intended to replace advice given to you by your health care provider. Make sure you discuss any questions you have with your health care provider.  Constipation Constipation is when a person has fewer than three bowel movements a week, has difficulty having a bowel movement, or has stools that are dry, hard, or larger than normal. As people grow older, constipation is more common. If you try to fix constipation with medicines that make  you have a bowel movement (laxatives), the problem may get worse. Long-term laxative use may cause the muscles of the colon to become weak. A low-fiber diet, not taking in enough fluids, and taking certain medicines may make constipation worse.  CAUSES   Certain medicines, such as antidepressants, pain medicine, iron supplements, antacids, and water pills.   Certain diseases, such as diabetes, irritable bowel syndrome (IBS), thyroid disease, or depression.   Not drinking enough water.   Not eating  enough fiber-rich foods.   Stress or travel.   Lack of physical activity or exercise.   Ignoring the urge to have a bowel movement.   Using laxatives too much.  SIGNS AND SYMPTOMS   Having fewer than three bowel movements a week.   Straining to have a bowel movement.   Having stools that are hard, dry, or larger than normal.   Feeling full or bloated.   Pain in the lower abdomen.   Not feeling relief after having a bowel movement.  DIAGNOSIS  Your health care provider will take a medical history and perform a physical exam. Further testing may be done for severe constipation. Some tests may include:  A barium enema X-ray to examine your rectum, colon, and, sometimes, your small intestine.   A sigmoidoscopy to examine your lower colon.   A colonoscopy to examine your entire colon. TREATMENT  Treatment will depend on the severity of your constipation and what is causing it. Some dietary treatments include drinking more fluids and eating more fiber-rich foods. Lifestyle treatments may include regular exercise. If these diet and lifestyle recommendations do not help, your health care provider may recommend taking over-the-counter laxative medicines to help you have bowel movements. Prescription medicines may be prescribed if over-the-counter medicines do not work.  HOME CARE INSTRUCTIONS   Eat foods that have a lot of fiber, such as fruits, vegetables, whole grains, and beans.  Limit foods high in fat and processed sugars, such as french fries, hamburgers, cookies, candies, and soda.   A fiber supplement may be added to your diet if you cannot get enough fiber from foods.   Drink enough fluids to keep your urine clear or pale yellow.   Exercise regularly or as directed by your health care provider.   Go to the restroom when you have the urge to go. Do not hold it.   Only take over-the-counter or prescription medicines as directed by your health care  provider. Do not take other medicines for constipation without talking to your health care provider first.  SEEK IMMEDIATE MEDICAL CARE IF:   You have bright red blood in your stool.   Your constipation lasts for more than 4 days or gets worse.   You have abdominal or rectal pain.   You have thin, pencil-like stools.   You have unexplained weight loss. MAKE SURE YOU:   Understand these instructions.  Will watch your condition.  Will get help right away if you are not doing well or get worse. Document Released: 02/17/2004 Document Revised: 05/26/2013 Document Reviewed: 03/02/2013 [facility] Patient Information 2015 [place], [place]. This information is not intended to replace advice given to you by your health care provider. Make sure you discuss any questions you have with your health care provider.  Gastritis, Adult Gastritis is soreness and swelling (inflammation) of the lining of the stomach. Gastritis can develop as a sudden onset (acute) or long-term (chronic) condition. If gastritis is not treated, it can lead to stomach bleeding and ulcers. CAUSES  Gastritis occurs when the stomach lining is weak or damaged. Digestive juices from the stomach then inflame the weakened stomach lining. The stomach lining may be weak or damaged due to viral or bacterial infections. One common bacterial infection is the Helicobacter pylori infection. Gastritis can also result from excessive alcohol consumption, taking certain medicines, or having too much acid in the stomach.  SYMPTOMS  In some cases, there are no symptoms. When symptoms are present, they may include:  Pain or a burning sensation in the upper abdomen.  Nausea.  Vomiting.  An uncomfortable feeling of fullness after eating. DIAGNOSIS  Your caregiver may suspect you have gastritis based on your symptoms and a physical exam. To determine the cause of your gastritis, your caregiver may perform the following:  Blood or stool  tests to check for the H pylori bacterium.  Gastroscopy. A thin, flexible tube (endoscope) is passed down the esophagus and into the stomach. The endoscope has a light and camera on the end. Your caregiver uses the endoscope to view the inside of the stomach.  Taking a tissue sample (biopsy) from the stomach to examine under a microscope. TREATMENT  Depending on the cause of your gastritis, medicines may be prescribed. If you have a bacterial infection, such as an H pylori infection, antibiotics may be given. If your gastritis is caused by too much acid in the stomach, H2 blockers or antacids may be given. Your caregiver may recommend that you stop taking aspirin, ibuprofen, or other nonsteroidal anti-inflammatory drugs (NSAIDs). HOME CARE INSTRUCTIONS  Only take over-the-counter or prescription medicines as directed by your caregiver.  If you were given antibiotic medicines, take them as directed. Finish them even if you start to feel better.  Drink enough fluids to keep your urine clear or pale yellow.  Avoid foods and drinks that make your symptoms worse, such as:  Caffeine or alcoholic drinks.  Chocolate.  Peppermint or mint flavorings.  Garlic and onions.  Spicy foods.  Citrus fruits, such as oranges, lemons, or limes.  Tomato-based foods such as sauce, chili, salsa, and pizza.  Fried and fatty foods.  Eat small, frequent meals instead of large meals. SEEK IMMEDIATE MEDICAL CARE IF:   You have black or dark red stools.  You vomit blood or material that looks like coffee grounds.  You are unable to keep fluids down.  Your abdominal pain gets worse.  You have a fever.  You do not feel better after 1 week.  You have any other questions or concerns. MAKE SURE YOU:  Understand these instructions.  Will watch your condition.  Will get help right away if you are not doing well or get worse. Document Released: 05/15/2001 Document Revised: 11/20/2011 Document  Reviewed: 07/04/2011 [facility] Patient Information 2015 [place]. This information is not intended to replace advice given to you by your health care provider. Make sure you discuss any questions you have with your health care provider.  Nausea and Vomiting Nausea means you feel sick to your stomach. Throwing up (vomiting) is a reflex where stomach contents come out of your mouth. HOME CARE   Take medicine as told by your doctor.  Do not force yourself to eat. However, you do need to drink fluids.  If you feel like eating, eat a normal diet as told by your doctor.  Eat rice, wheat, potatoes, bread, lean meats, yogurt, fruits, and vegetables.  Avoid high-fat foods.  Drink enough fluids to keep your pee (urine) clear or pale yellow.  Ask your doctor how to replace body fluid losses (rehydrate). Signs of body fluid loss (dehydration) include:  Feeling very thirsty.  Dry lips and mouth.  Feeling dizzy.  Dark pee.  Peeing less than normal.  Feeling confused.  Fast breathing or heart rate. GET HELP RIGHT AWAY IF:   You have blood in your throw up.  You have black or bloody poop (stool).  You have a bad headache or stiff neck.  You feel confused.  You have bad belly (abdominal) pain.  You have chest pain or trouble breathing.  You do not pee at least once every 8 hours.  You have cold, clammy skin.  You keep throwing up after 24 to 48 hours.  You have a fever. MAKE SURE YOU:   Understand these instructions.  Will watch your condition.  Will get help right away if you are not doing well or get worse. Document Released: 11/07/2007 Document Revised: 08/13/2011 Document Reviewed: 10/20/2010 [facility] Patient Information 2015 [place], [place]. This information is not intended to replace advice given to you by your health care provider. Make sure you discuss any questions you have with your health care provider.  Smoking Cessation, Tips for Success If you are  ready to quit smoking, congratulations! You have chosen to help yourself be healthier. Cigarettes bring nicotine, tar, carbon monoxide, and other irritants into your body. Your lungs, heart, and blood vessels will be able to work better without these poisons. There are many different ways to quit smoking. Nicotine gum, nicotine patches, a nicotine inhaler, or nicotine nasal spray can help with physical craving. Hypnosis, support groups, and medicines help break the habit of smoking. WHAT THINGS CAN I DO TO MAKE QUITTING EASIER?  Here are some tips to help you quit for good:  Pick a date when you will quit smoking completely. Tell all of your friends and family about your plan to quit on that date.  Do not try to slowly cut down on the number of cigarettes you are smoking. Pick a quit date and quit smoking completely starting on that day.  Throw away all cigarettes.   Clean and remove all ashtrays from your home, work, and car.  On a card, write down your reasons for quitting. Carry the card with you and read it when you get the urge to smoke.  Cleanse your body of nicotine. Drink enough water and fluids to keep your urine clear or pale yellow. Do this after quitting to flush the nicotine from your body.  Learn to predict your moods. Do not let a bad situation be your excuse to have a cigarette. Some situations in your life might tempt you into wanting a cigarette.  Never have "just one" cigarette. It leads to wanting another and another. Remind yourself of your decision to quit.  Change habits associated with smoking. If you smoked while driving or when feeling stressed, try other activities to replace smoking. Stand up when drinking your coffee. Brush your teeth after eating. Sit in a different chair when you read the paper. Avoid alcohol while trying to quit, and try to drink fewer caffeinated beverages. Alcohol and caffeine may urge you to smoke.  Avoid foods and drinks that can trigger a  desire to smoke, such as sugary or spicy foods and alcohol.  Ask people who smoke not to smoke around you.  Have something planned to do right after eating or having a cup of coffee. For example, plan to take a walk or exercise.  Try a relaxation exercise to calm you down and decrease your stress. Remember, you may be tense and nervous for the first 2 weeks after you quit, but this will pass.  Find new activities to keep your hands busy. Play with a pen, coin, or rubber band. Doodle or draw things on paper.  Brush your teeth right after eating. This will help cut down on the craving for the taste of tobacco after meals. You can also try mouthwash.   Use oral substitutes in place of cigarettes. Try using lemon drops, carrots, cinnamon sticks, or chewing gum. Keep them handy so they are available when you have the urge to smoke.  When you have the urge to smoke, try deep breathing.  Designate your home as a nonsmoking area.  If you are a heavy smoker, ask your health care provider about a prescription for nicotine chewing gum. It can ease your withdrawal from nicotine.  Reward yourself. Set aside the cigarette money you save and buy yourself something nice.  Look for support from others. Join a support group or smoking cessation program. Ask someone at home or at work to help you with your plan to quit smoking.  Always ask yourself, "Do I need this cigarette or is this just a reflex?" Tell yourself, "Today, I choose not to smoke," or "I do not want to smoke." You are reminding yourself of your decision to quit.  Do not replace cigarette smoking with electronic cigarettes (commonly called e-cigarettes). The safety of e-cigarettes is unknown, and some may contain harmful chemicals.  If you relapse, do not give up! Plan ahead and think about what you will do the next time you get the urge to smoke. HOW WILL I FEEL WHEN I QUIT SMOKING? You may have symptoms of withdrawal because your body is  used to nicotine (the addictive substance in cigarettes). You may crave cigarettes, be irritable, feel very hungry, cough often, get headaches, or have difficulty concentrating. The withdrawal symptoms are only temporary. They are strongest when you first quit but will go away within 10-14 days. When withdrawal symptoms occur, stay in control. Think about your reasons for quitting. Remind yourself that these are signs that your body is healing and getting used to being without cigarettes. Remember that withdrawal symptoms are easier to treat than the major diseases that smoking can cause.  Even after the withdrawal is over, expect periodic urges to smoke. However, these cravings are generally short lived and will go away whether you smoke or not. Do not smoke! WHAT RESOURCES ARE AVAILABLE TO HELP ME QUIT SMOKING? Your health care provider can direct you to community resources or hospitals for support, which may include:  Group support.  Education.  Hypnosis.  Therapy. Document Released: 02/17/2004 Document Revised: 10/05/2013 Document Reviewed: 11/06/2012 [facility] Patient Information 2015 [place], [place]. This information is not intended to replace advice given to you by your health care provider. Make sure you discuss any questions you have with your health care provider.

## 2014-12-06 NOTE — ED Notes (Signed)
Pt placed in gown and in bed. Pt monitored by pulse ox, bp cuff, and 12-lead. 

## 2014-12-06 NOTE — ED Notes (Signed)
Pt brought to room, undressed, in gown, on monitor, continuous pulse oximetry and blood pressure cuff

## 2017-02-11 ENCOUNTER — Inpatient Hospital Stay: Admit: 2017-02-11 | Payer: MEDICAID | Attending: Family | Primary: Family

## 2017-02-11 ENCOUNTER — Encounter

## 2017-02-11 DIAGNOSIS — R1900 Intra-abdominal and pelvic swelling, mass and lump, unspecified site: Secondary | ICD-10-CM

## 2017-05-08 ENCOUNTER — Emergency Department: Admit: 2017-05-09 | Payer: MEDICAID | Primary: Family

## 2017-05-08 DIAGNOSIS — K529 Noninfective gastroenteritis and colitis, unspecified: Secondary | ICD-10-CM

## 2017-05-08 NOTE — ED Notes (Signed)
Pt returned from CT no distress noted pt denies pain at this time.

## 2017-05-08 NOTE — ED Provider Notes (Incomplete)
EMERGENCY DEPARTMENT HISTORY AND PHYSICAL EXAM    Date: 05/08/2017  Patient Name: Martha Dodson    History of Presenting Illness     Chief Complaint   Patient presents with   ??? Abdominal Pain         History Provided By: Patient    Chief Complaint: Abd pain  Duration: 4 Days  Timing:  Acute  Location: Epigastric  Modifying Factors: Pt notes no worsening or relieving sx  Associated Symptoms: vomiting, diaphoresis, nausea, diarrhea, lower back pain, urinary incontinence    Additional History (Context):   9:49 PM  Martha Dodson is a 26 y.o. female with PMHX of Hypothyroidism who presents to the emergency department C/O epigastric abd pain onset 4 hours ago. Associated sxs include vomiting, diaphoresis, nausea, diarrhea (3 episodes), lower back pain, headache, urinary incontinence. Pt denies IV drug use, eating when pain started, recent abx, sick contacts, fever, and any other sxs or complaints.     PCP: Georges Mouse, NP    Current Outpatient Medications   Medication Sig Dispense Refill   ??? levothyroxine (SYNTHROID) 150 mcg tablet Take 150 mcg by mouth.     ??? dicyclomine (BENTYL) 20 mg tablet Take 1 Tab by mouth every six (6) hours for 20 doses. 20 Tab 0   ??? ondansetron hcl (ZOFRAN) 4 mg tablet Take 1 Tab by mouth every eight (8) hours as needed for Nausea. 15 Tab 0       Past History     Past Medical History:  Past Medical History:   Diagnosis Date   ??? Cataract    ??? Hypothyroidism        Past Surgical History:  History reviewed. No pertinent surgical history.    Family History:  History reviewed. No pertinent family history.    Social History:  Social History     Tobacco Use   ??? Smoking status: Current Every Day Smoker     Packs/day: 0.50   ??? Smokeless tobacco: Never Used   Substance Use Topics   ??? Alcohol use: No     Frequency: Never   ??? Drug use: No       Allergies:  Allergies   Allergen Reactions   ??? Latex Rash         Review of Systems   Review of Systems    Constitutional: Positive for diaphoresis. Negative for fever.   Gastrointestinal: Positive for abdominal pain, diarrhea, nausea and vomiting.   Genitourinary:        (+) urinary incontinence     Musculoskeletal: Positive for back pain (lower).   Neurological: Positive for headaches.   All other systems reviewed and are negative.      Physical Exam     Vitals:    05/08/17 2017   BP: 130/59   Pulse: 80   Resp: 18   Temp: 98 ??F (36.7 ??C)   SpO2: 100%   Weight: 81.6 kg (180 lb)   Height: 5' 1"  (1.549 m)     Physical Exam   Constitutional: She is oriented to person, place, and time. She appears well-developed and well-nourished.   HENT:   Head: Normocephalic and atraumatic.   Neck: Normal range of motion. Neck supple.   Cardiovascular: Normal rate, regular rhythm, normal heart sounds and intact distal pulses.   No murmur heard.  Pulmonary/Chest: Effort normal and breath sounds normal. No respiratory distress. She has no wheezes. She has no rales.   Abdominal: Soft. Bowel sounds are normal. There  is no tenderness.   Musculoskeletal:        Back:    Neurological: She is alert and oriented to person, place, and time.   Psychiatric: She has a normal mood and affect. Judgment normal.   Nursing note and vitals reviewed.        Diagnostic Study Results     Labs -     Recent Results (from the past 12 hour(s))   URINALYSIS W/ RFLX MICROSCOPIC    Collection Time: 05/08/17  8:30 PM   Result Value Ref Range    Color YELLOW      Appearance CLOUDY      Specific gravity 1.028 1.005 - 1.030      pH (UA) 5.5 5.0 - 8.0      Protein NEGATIVE  NEG mg/dL    Glucose NEGATIVE  NEG mg/dL    Ketone TRACE (A) NEG mg/dL    Bilirubin NEGATIVE  NEG      Blood NEGATIVE  NEG      Urobilinogen 0.2 0.2 - 1.0 EU/dL    Nitrites NEGATIVE  NEG      Leukocyte Esterase NEGATIVE  NEG     HCG URINE, QL    Collection Time: 05/08/17  8:30 PM   Result Value Ref Range    HCG urine, QL NEGATIVE  NEG     CBC WITH AUTOMATED DIFF    Collection Time: 05/08/17  8:50 PM    Result Value Ref Range    WBC 18.8 (H) 4.6 - 13.2 K/uL    RBC 5.39 (H) 4.20 - 5.30 M/uL    HGB 14.8 12.0 - 16.0 g/dL    HCT 45.4 (H) 35.0 - 45.0 %    MCV 84.2 74.0 - 97.0 FL    MCH 27.5 24.0 - 34.0 PG    MCHC 32.6 31.0 - 37.0 g/dL    RDW 14.1 11.6 - 14.5 %    PLATELET 263 135 - 420 K/uL    MPV 11.3 9.2 - 11.8 FL    NEUTROPHILS 92 (H) 40 - 73 %    LYMPHOCYTES 5 (L) 21 - 52 %    MONOCYTES 3 3 - 10 %    EOSINOPHILS 0 0 - 5 %    BASOPHILS 0 0 - 2 %    ABS. NEUTROPHILS 17.2 (H) 1.8 - 8.0 K/UL    ABS. LYMPHOCYTES 1.0 0.9 - 3.6 K/UL    ABS. MONOCYTES 0.6 0.05 - 1.2 K/UL    ABS. EOSINOPHILS 0.1 0.0 - 0.4 K/UL    ABS. BASOPHILS 0.0 0.0 - 0.1 K/UL    DF AUTOMATED     METABOLIC PANEL, COMPREHENSIVE    Collection Time: 05/08/17  8:50 PM   Result Value Ref Range    Sodium 138 136 - 145 mmol/L    Potassium 3.5 3.5 - 5.5 mmol/L    Chloride 104 100 - 108 mmol/L    CO2 25 21 - 32 mmol/L    Anion gap 9 3.0 - 18 mmol/L    Glucose 100 (H) 74 - 99 mg/dL    BUN 18 7.0 - 18 MG/DL    Creatinine 0.84 0.6 - 1.3 MG/DL    BUN/Creatinine ratio 21      GFR est AA >60 >60 ml/min/1.83m    GFR est non-AA >60 >60 ml/min/1.769m   Calcium 9.1 8.5 - 10.1 MG/DL    Bilirubin, total 0.4 0.2 - 1.0 MG/DL    ALT (SGPT) 17 13 - 56  U/L    AST (SGOT) 11 (L) 15 - 37 U/L    Alk. phosphatase 69 45 - 117 U/L    Protein, total 8.7 (H) 6.4 - 8.2 g/dL    Albumin 4.2 3.4 - 5.0 g/dL    Globulin 4.5 (H) 2.0 - 4.0 g/dL    A-G Ratio 0.9 0.8 - 1.7     LIPASE    Collection Time: 05/08/17  8:50 PM   Result Value Ref Range    Lipase 107 73 - 393 U/L       Radiologic Studies -   CT ABD PELV W CONT   Final Result        CT Results  (Last 48 hours)               05/08/17 2230  CT ABD PELV W CONT Final result    Impression:  IMPRESSION:           1. Fluid-filled but nondilated loops of large and small bowel is nonspecific.   Doubt obstruction. Normal appendix.    2. Small amount of free fluid in the true pelvis could be physiologic in amount.       3. Otherwise negative.                        Narrative:  EXAM: CT ABD PELV W CONT       CLINICAL INDICATION/HISTORY:  Abdominal pain; Diarrhea.   -Additional: None       COMPARISON: None       TECHNIQUE: Axial CT imaging of the abdomen and pelvis was performed post   intravenous contrast. Multiplanar reformats were generated. One or more dose   reduction techniques were used on this CT: automated exposure control,   adjustment of the mAs and/or kVp according to patient size, and iterative   reconstruction techniques.  The specific techniques used on this CT exam have   been documented in the patient's electronic medical record.       _______________       FINDINGS:       LOWER CHEST: Unremarkable.       LIVER, BILIARY: Liver is normal. No biliary dilation. Gallbladder is   unremarkable.       PANCREAS: Normal.       SPLEEN: Normal.       ADRENALS: Normal.       KIDNEYS/URETERS/BLADDER: Normal.       PELVIC ORGANS: Uterus appears unremarkable. There is a small amount of free   fluid in the pelvis.       LYMPH NODES: No enlarged lymph nodes.       GASTROINTESTINAL TRACT: There are multiple fluid-filled loops of large and small   bowel without distention or wall thickening. No free intraperitoneal air. Normal   appendix.       VASCULATURE: Unremarkable.       BONES: No acute or aggressive osseous abnormalities identified.       OTHER: None.       _______________               CXR Results  (Last 48 hours)    None          Medications given in the ED-  Medications   morphine injection 2 mg (2 mg IntraVENous Given 05/08/17 2205)   ondansetron (ZOFRAN) injection 4 mg (4 mg IntraVENous Given 05/08/17 2204)   sodium chloride 0.9 % bolus infusion 1,000 mL (0 mL  IntraVENous IV Completed 05/08/17 2318)   iopamidol (ISOVUE 300) 61 % contrast injection 100 mL (100 mL IntraVENous Given 05/08/17 2215)         Medical Decision Making   I am the first provider for this patient.    I reviewed the vital signs, available nursing notes, past medical history,  past surgical history, family history and social history.    Vital Signs-Reviewed the patient's vital signs.    Pulse Oximetry Analysis - 100% on Room Air     Records Reviewed: Nursing Notes    Provider Notes (Medical Decision Making): ***    Procedures:  Procedures   PROCEDURE NOTE - RECTAL EXAM:   9:53 PM  Performed by: Haynes Kerns, PA-C  Rectal exam performed. Pt has rectal tone.  The procedure took 1-15 minutes, and pt tolerated well.  Written by Ronnell Freshwater, ED Scribe, as dictated by Haynes Kerns, PA-C.        ED Course:   9:49 PM   Initial assessment performed. The patients presenting problems have been discussed, and they are in agreement with the care plan formulated and outlined with them.  I have encouraged them to ask questions as they arise throughout their visit.    Diagnosis and Disposition     Discussion: Pt presents with n/v/d and abd cramping. Afebrile, non-toxic,complaining of urinary incontinence, though initially with vomiting only but now has "leaking." Exam reassuring. She is neurovascularly intact in bilateral lower extremities. Good reflexes and rectal tone, doubt cauda equina. She does have leukocytosis, suspect reactive. CT consistent with gastroenteritis, will treat with Zofran and Bentyl and have pt follow up.    DISCHARGE NOTE:  11:03 PM  Junella Huaracha's  results have been reviewed with her.  She has been counseled regarding her diagnosis, treatment, and plan.  She verbally conveys understanding and agreement of the signs, symptoms, diagnosis, treatment and prognosis and additionally agrees to follow up as discussed.  She also agrees with the care-plan and conveys that all of her questions have been answered.  I have also provided discharge instructions for her that include: educational information regarding their diagnosis and treatment, and list of reasons why they would want to return to the ED prior to their follow-up appointment, should her condition change. She has  been provided with education for proper emergency department utilization.     CLINICAL IMPRESSION:    1. Gastroenteritis, acute        PLAN:  1. D/C Home  2.   Discharge Medication List as of 05/08/2017 11:02 PM      START taking these medications    Details   dicyclomine (BENTYL) 20 mg tablet Take 1 Tab by mouth every six (6) hours for 20 doses., Print, Disp-20 Tab, R-0      ondansetron hcl (ZOFRAN) 4 mg tablet Take 1 Tab by mouth every eight (8) hours as needed for Nausea., Print, Disp-15 Tab, R-0         CONTINUE these medications which have NOT CHANGED    Details   levothyroxine (SYNTHROID) 150 mcg tablet Take 150 mcg by mouth., Historical Med           3.   Follow-up Information     Follow up With Specialties Details Why Contact Info    El-Safadi, Marcia Brash, MD Gastroenterology Schedule an appointment as soon as possible for a visit in 3 days For GI follow up [address]  [address]  [place] [address]  [phone]      [facility] EMERGENCY DEPT Emergency Medicine Go to As needed, if symptoms worsen [address]  [place] [address]  [phone]        _______________________________    Attestations:  This note is prepared by Ronnell Freshwater, acting as Scribe for [employer], PA-C.    Haynes Kerns, PA-C:  The scribe's documentation has been prepared under my direction and personally reviewed by me in its entirety.  I confirm that the note above accurately reflects all work, treatment, procedures, and medical decision making performed by me.  _______________________________      *** PLEASE DO NOT SIGN THIS CHART

## 2017-05-08 NOTE — ED Triage Notes (Signed)
Pt arrives ambulatory to ED with c\\o abd pain x 3 hours, pt sts she has had an episode of vomiting and diarrhea, pt is able to make needs known speaking in complete sentences, pt in nad at this time

## 2017-05-08 NOTE — ED Provider Notes (Signed)
EMERGENCY DEPARTMENT HISTORY AND PHYSICAL EXAM    Date: 05/08/2017  Patient Name: Martha Dodson    History of Presenting Illness     Chief Complaint   Patient presents with   ??? Abdominal Pain         History Provided By: Patient    Chief Complaint: Abd pain  Duration: 4 Days  Timing:  Acute  Location: Epigastric  Modifying Factors: Pt notes no worsening or relieving sx  Associated Symptoms: vomiting, diaphoresis, nausea, diarrhea, lower back pain, urinary incontinence    Additional History (Context):   9:49 PM  Martha Dodson is a 26 y.o. female with PMHX of Hypothyroidism who presents to the emergency department C/O epigastric abd pain onset 4 hours ago. Associated sxs include vomiting, diaphoresis, nausea, diarrhea (3 episodes), lower back pain, headache, urinary incontinence. Pt denies IV drug use, eating when pain started, recent abx, sick contacts, fever, and any other sxs or complaints.     PCP: Georges Mouse, NP    Current Outpatient Medications   Medication Sig Dispense Refill   ??? levothyroxine (SYNTHROID) 150 mcg tablet Take 150 mcg by mouth.     ??? dicyclomine (BENTYL) 20 mg tablet Take 1 Tab by mouth every six (6) hours for 20 doses. 20 Tab 0   ??? ondansetron hcl (ZOFRAN) 4 mg tablet Take 1 Tab by mouth every eight (8) hours as needed for Nausea. 15 Tab 0       Past History     Past Medical History:  Past Medical History:   Diagnosis Date   ??? Cataract    ??? Hypothyroidism        Past Surgical History:  History reviewed. No pertinent surgical history.    Family History:  History reviewed. No pertinent family history.    Social History:  Social History     Tobacco Use   ??? Smoking status: Current Every Day Smoker     Packs/day: 0.50   ??? Smokeless tobacco: Never Used   Substance Use Topics   ??? Alcohol use: No     Frequency: Never   ??? Drug use: No       Allergies:  Allergies   Allergen Reactions   ??? Latex Rash         Review of Systems   Review of Systems    Constitutional: Positive for diaphoresis. Negative for fever.   Gastrointestinal: Positive for abdominal pain, diarrhea, nausea and vomiting.   Genitourinary:        (+) urinary incontinence     Musculoskeletal: Positive for back pain (lower).   Neurological: Positive for headaches.   All other systems reviewed and are negative.      Physical Exam     Vitals:    05/08/17 2017   BP: 130/59   Pulse: 80   Resp: 18   Temp: 98 ??F (36.7 ??C)   SpO2: 100%   Weight: 81.6 kg (180 lb)   Height: 5' 1"  (1.549 m)     Physical Exam   Constitutional: She is oriented to person, place, and time. She appears well-developed and well-nourished.   Alert, lying on stretcher, non toxic, laughing with SO at bedside, no active vomiting    HENT:   Head: Normocephalic and atraumatic.   Right Ear: Tympanic membrane, external ear and ear canal normal. No mastoid tenderness. Tympanic membrane is not perforated, not erythematous, not retracted and not bulging. No hemotympanum.   Left Ear: Tympanic membrane, external ear and ear  canal normal. No mastoid tenderness. Tympanic membrane is not perforated, not erythematous, not retracted and not bulging. No hemotympanum.   Nose: Nose normal.   Mouth/Throat: Uvula is midline, oropharynx is clear and moist and mucous membranes are normal. No trismus in the jaw. No uvula swelling. No oropharyngeal exudate, posterior oropharyngeal edema, posterior oropharyngeal erythema or tonsillar abscesses.   Eyes: EOM are normal. Pupils are equal, round, and reactive to light.   Neck: Normal range of motion. Neck supple. No spinous process tenderness and no muscular tenderness present. No neck rigidity. Normal range of motion present. No Brudzinski's sign and no Kernig's sign noted.   No meningismus   No lymphadenopathy    Cardiovascular: Normal rate, regular rhythm, normal heart sounds and intact distal pulses.   No murmur heard.  Pulmonary/Chest: Effort normal and breath sounds normal. No respiratory  distress. She has no wheezes. She has no rales.   Abdominal: Soft. Bowel sounds are normal. There is generalized tenderness. There is no rigidity, no rebound, no guarding, no CVA tenderness and no tenderness at McBurney's point.   Genitourinary:   Genitourinary Comments: Rectal exam: good rectal tone    Musculoskeletal: Normal range of motion.        Back:    Neurological: She is alert and oriented to person, place, and time. She has normal strength. She displays no atrophy and no tremor. No cranial nerve deficit or sensory deficit. She exhibits normal muscle tone. Coordination and gait normal. GCS eye subscore is 4. GCS verbal subscore is 5. GCS motor subscore is 6.   Reflex Scores:       Patellar reflexes are 2+ on the right side and 2+ on the left side.  No neuro deficit   No pronator drift  Normal finger nose finger  N/V intact distally   Strength 5/5 and equal in all extremities   No slurred speech   No facial droop    Skin: Skin is warm and dry. No rash noted.   Psychiatric: She has a normal mood and affect. Judgment normal.   Nursing note and vitals reviewed.        Diagnostic Study Results     Labs -     Recent Results (from the past 12 hour(s))   URINALYSIS W/ RFLX MICROSCOPIC    Collection Time: 05/08/17  8:30 PM   Result Value Ref Range    Color YELLOW      Appearance CLOUDY      Specific gravity 1.028 1.005 - 1.030      pH (UA) 5.5 5.0 - 8.0      Protein NEGATIVE  NEG mg/dL    Glucose NEGATIVE  NEG mg/dL    Ketone TRACE (A) NEG mg/dL    Bilirubin NEGATIVE  NEG      Blood NEGATIVE  NEG      Urobilinogen 0.2 0.2 - 1.0 EU/dL    Nitrites NEGATIVE  NEG      Leukocyte Esterase NEGATIVE  NEG     HCG URINE, QL    Collection Time: 05/08/17  8:30 PM   Result Value Ref Range    HCG urine, QL NEGATIVE  NEG     CBC WITH AUTOMATED DIFF    Collection Time: 05/08/17  8:50 PM   Result Value Ref Range    WBC 18.8 (H) 4.6 - 13.2 K/uL    RBC 5.39 (H) 4.20 - 5.30 M/uL    HGB 14.8 12.0 - 16.0 g/dL  HCT 45.4 (H) 35.0 - 45.0 %    MCV 84.2 74.0 - 97.0 FL    MCH 27.5 24.0 - 34.0 PG    MCHC 32.6 31.0 - 37.0 g/dL    RDW 14.1 11.6 - 14.5 %    PLATELET 263 135 - 420 K/uL    MPV 11.3 9.2 - 11.8 FL    NEUTROPHILS 92 (H) 40 - 73 %    LYMPHOCYTES 5 (L) 21 - 52 %    MONOCYTES 3 3 - 10 %    EOSINOPHILS 0 0 - 5 %    BASOPHILS 0 0 - 2 %    ABS. NEUTROPHILS 17.2 (H) 1.8 - 8.0 K/UL    ABS. LYMPHOCYTES 1.0 0.9 - 3.6 K/UL    ABS. MONOCYTES 0.6 0.05 - 1.2 K/UL    ABS. EOSINOPHILS 0.1 0.0 - 0.4 K/UL    ABS. BASOPHILS 0.0 0.0 - 0.1 K/UL    DF AUTOMATED     METABOLIC PANEL, COMPREHENSIVE    Collection Time: 05/08/17  8:50 PM   Result Value Ref Range    Sodium 138 136 - 145 mmol/L    Potassium 3.5 3.5 - 5.5 mmol/L    Chloride 104 100 - 108 mmol/L    CO2 25 21 - 32 mmol/L    Anion gap 9 3.0 - 18 mmol/L    Glucose 100 (H) 74 - 99 mg/dL    BUN 18 7.0 - 18 MG/DL    Creatinine 0.84 0.6 - 1.3 MG/DL    BUN/Creatinine ratio 21      GFR est AA >60 >60 ml/min/1.70m    GFR est non-AA >60 >60 ml/min/1.736m   Calcium 9.1 8.5 - 10.1 MG/DL    Bilirubin, total 0.4 0.2 - 1.0 MG/DL    ALT (SGPT) 17 13 - 56 U/L    AST (SGOT) 11 (L) 15 - 37 U/L    Alk. phosphatase 69 45 - 117 U/L    Protein, total 8.7 (H) 6.4 - 8.2 g/dL    Albumin 4.2 3.4 - 5.0 g/dL    Globulin 4.5 (H) 2.0 - 4.0 g/dL    A-G Ratio 0.9 0.8 - 1.7     LIPASE    Collection Time: 05/08/17  8:50 PM   Result Value Ref Range    Lipase 107 73 - 393 U/L       Radiologic Studies -   CT ABD PELV W CONT   Final Result        CT Results  (Last 48 hours)               05/08/17 2230  CT ABD PELV W CONT Final result    Impression:  IMPRESSION:           1. Fluid-filled but nondilated loops of large and small bowel is nonspecific.   Doubt obstruction. Normal appendix.    2. Small amount of free fluid in the true pelvis could be physiologic in amount.       3. Otherwise negative.                       Narrative:  EXAM: CT ABD PELV W CONT       CLINICAL INDICATION/HISTORY:  Abdominal pain; Diarrhea.    -Additional: None       COMPARISON: None       TECHNIQUE: Axial CT imaging of the abdomen and pelvis was performed post   intravenous contrast. Multiplanar  reformats were generated. One or more dose   reduction techniques were used on this CT: automated exposure control,   adjustment of the mAs and/or kVp according to patient size, and iterative   reconstruction techniques.  The specific techniques used on this CT exam have   been documented in the patient's electronic medical record.       _______________       FINDINGS:       LOWER CHEST: Unremarkable.       LIVER, BILIARY: Liver is normal. No biliary dilation. Gallbladder is   unremarkable.       PANCREAS: Normal.       SPLEEN: Normal.       ADRENALS: Normal.       KIDNEYS/URETERS/BLADDER: Normal.       PELVIC ORGANS: Uterus appears unremarkable. There is a small amount of free   fluid in the pelvis.       LYMPH NODES: No enlarged lymph nodes.       GASTROINTESTINAL TRACT: There are multiple fluid-filled loops of large and small   bowel without distention or wall thickening. No free intraperitoneal air. Normal   appendix.       VASCULATURE: Unremarkable.       BONES: No acute or aggressive osseous abnormalities identified.       OTHER: None.       _______________               CXR Results  (Last 48 hours)    None          Medications given in the ED-  Medications   morphine injection 2 mg (2 mg IntraVENous Given 05/08/17 2205)   ondansetron (ZOFRAN) injection 4 mg (4 mg IntraVENous Given 05/08/17 2204)   sodium chloride 0.9 % bolus infusion 1,000 mL (0 mL IntraVENous IV Completed 05/08/17 2318)   iopamidol (ISOVUE 300) 61 % contrast injection 100 mL (100 mL IntraVENous Given 05/08/17 2215)         Medical Decision Making   I am the first provider for this patient.    I reviewed the vital signs, available nursing notes, past medical history, past surgical history, family history and social history.    Vital Signs-Reviewed the patient's vital signs.     Pulse Oximetry Analysis - 100% on Room Air     Records Reviewed: Nursing Notes    Provider Notes (Medical Decision Making):     Procedures:  Procedures   PROCEDURE NOTE - RECTAL EXAM:   9:53 PM  Performed by: Haynes Kerns, PA-C  Rectal exam performed. Pt has rectal tone.  The procedure took 1-15 minutes, and pt tolerated well.  Written by Ronnell Freshwater, ED Scribe, as dictated by Haynes Kerns, PA-C.        ED Course:   9:49 PM   Initial assessment performed. The patients presenting problems have been discussed, and they are in agreement with the care plan formulated and outlined with them.  I have encouraged them to ask questions as they arise throughout their visit.    Diagnosis and Disposition     Discussion: Pt presents with n/v/d and abd cramping and generalized lower back pain. Afebrile, non-toxic,complaining of urinary incontinence, though initially with vomiting only but now has "leaking." Exam reassuring. She is neurovascularly intact in bilateral lower extremities. Good reflexes and rectal tone, doubt cauda equina. She does have leukocytosis, suspect reactive to vomiting. CT consistent with gastroenteritis, will treat with Zofran and Bentyl and have pt  follow up. Pt felling much better, ready for d/c    DISCHARGE NOTE:  11:03 PM  Marti Alcivar's  results have been reviewed with her.  She has been counseled regarding her diagnosis, treatment, and plan.  She verbally conveys understanding and agreement of the signs, symptoms, diagnosis, treatment and prognosis and additionally agrees to follow up as discussed.  She also agrees with the care-plan and conveys that all of her questions have been answered.  I have also provided discharge instructions for her that include: educational information regarding their diagnosis and treatment, and list of reasons why they would want to return to the ED prior to their follow-up appointment, should her condition change. She has  been provided with education for proper emergency department utilization.     CLINICAL IMPRESSION:    1. Gastroenteritis, acute        PLAN:  1. D/C Home  2.   Discharge Medication List as of 05/08/2017 11:02 PM      START taking these medications    Details   dicyclomine (BENTYL) 20 mg tablet Take 1 Tab by mouth every six (6) hours for 20 doses., Print, Disp-20 Tab, R-0      ondansetron hcl (ZOFRAN) 4 mg tablet Take 1 Tab by mouth every eight (8) hours as needed for Nausea., Print, Disp-15 Tab, R-0         CONTINUE these medications which have NOT CHANGED    Details   levothyroxine (SYNTHROID) 150 mcg tablet Take 150 mcg by mouth., Historical Med           3.   Follow-up Information     Follow up With Specialties Details Why Contact Info    El-Safadi, Marcia Brash, MD Gastroenterology Schedule an appointment as soon as possible for a visit in 3 days For GI follow up [place] [id number]  [phone]      [facility] EMERGENCY DEPT Emergency Medicine Go to As needed, if symptoms worsen [address]  [place] [address]  [phone]        _______________________________    Attestations:  This note is prepared by Ronnell Freshwater, acting as Scribe for [employer], PA-C.    Haynes Kerns, PA-C:  The scribe's documentation has been prepared under my direction and personally reviewed by me in its entirety.  I confirm that the note above accurately reflects all work, treatment, procedures, and medical decision making performed by me.  _______________________________

## 2017-05-08 NOTE — ED Notes (Signed)
I have reviewed discharge instructions with the patient.  The patient verbalized understanding.I have reviewed the provider's instructions with the patient, answering all questions to her satisfaction.Patient armband removed and shreddedDischarge medications reviewed with patient and appropriate educational materials and side effects teaching were provided. Pt removed all belongings and ambulated without distress or discomfort.

## 2017-05-08 NOTE — ED Notes (Signed)
Pt transported at this time to CT via stretcher.

## 2017-05-09 ENCOUNTER — Inpatient Hospital Stay
Admit: 2017-05-09 | Discharge: 2017-05-09 | Disposition: A | Discharge status: Home / Self Care | Payer: MEDICAID | Attending: Emergency Medicine

## 2017-05-09 LAB — URINALYSIS W/ RFLX MICROSCOPIC
Bilirubin: NEGATIVE
Blood: NEGATIVE
Glucose: NEGATIVE mg/dL
Leukocyte Esterase: NEGATIVE
Nitrites: NEGATIVE
Protein: NEGATIVE mg/dL
Specific gravity: 1.028 (ref 1.005–1.030)
Urobilinogen: 0.2 EU/dL (ref 0.2–1.0)
pH (UA): 5.5 (ref 5.0–8.0)

## 2017-05-09 LAB — METABOLIC PANEL, COMPREHENSIVE
A-G Ratio: 0.9 (ref 0.8–1.7)
ALT (SGPT): 17 U/L (ref 13–56)
AST (SGOT): 11 U/L — ABNORMAL LOW (ref 15–37)
Albumin: 4.2 g/dL (ref 3.4–5.0)
Alk. phosphatase: 69 U/L (ref 45–117)
Anion gap: 9 mmol/L (ref 3.0–18)
BUN/Creatinine ratio: 21
BUN: 18 MG/DL (ref 7.0–18)
Bilirubin, total: 0.4 MG/DL (ref 0.2–1.0)
CO2: 25 mmol/L (ref 21–32)
Calcium: 9.1 MG/DL (ref 8.5–10.1)
Chloride: 104 mmol/L (ref 100–108)
Creatinine: 0.84 MG/DL (ref 0.6–1.3)
GFR est AA: >60 mL/min/{1.73_m2} (ref >60)
GFR est non-AA: >60 mL/min/{1.73_m2} (ref >60)
Globulin: 4.5 g/dL — ABNORMAL HIGH (ref 2.0–4.0)
Glucose: 100 mg/dL — ABNORMAL HIGH (ref 74–99)
Potassium: 3.5 mmol/L (ref 3.5–5.5)
Protein, total: 8.7 g/dL — ABNORMAL HIGH (ref 6.4–8.2)
Sodium: 138 mmol/L (ref 136–145)

## 2017-05-09 LAB — CBC WITH AUTOMATED DIFF
ABS. BASOPHILS: 0 10*3/uL (ref 0.0–0.1)
ABS. EOSINOPHILS: 0.1 10*3/uL (ref 0.0–0.4)
ABS. LYMPHOCYTES: 1 10*3/uL (ref 0.9–3.6)
ABS. MONOCYTES: 0.6 10*3/uL (ref 0.05–1.2)
ABS. NEUTROPHILS: 17.2 10*3/uL — ABNORMAL HIGH (ref 1.8–8.0)
BASOPHILS: 0 % (ref 0–2)
EOSINOPHILS: 0 % (ref 0–5)
HCT: 45.4 % — ABNORMAL HIGH (ref 35.0–45.0)
HGB: 14.8 g/dL (ref 12.0–16.0)
LYMPHOCYTES: 5 % — ABNORMAL LOW (ref 21–52)
MCH: 27.5 PG (ref 24.0–34.0)
MCHC: 32.6 g/dL (ref 31.0–37.0)
MCV: 84.2 FL (ref 74.0–97.0)
MONOCYTES: 3 % (ref 3–10)
MPV: 11.3 FL (ref 9.2–11.8)
NEUTROPHILS: 92 % — ABNORMAL HIGH (ref 40–73)
PLATELET: 263 10*3/uL (ref 135–420)
RBC: 5.39 M/uL — ABNORMAL HIGH (ref 4.20–5.30)
RDW: 14.1 % (ref 11.6–14.5)
WBC: 18.8 10*3/uL — ABNORMAL HIGH (ref 4.6–13.2)

## 2017-05-09 LAB — HCG URINE, QL: HCG urine, QL: NEGATIVE

## 2017-05-09 LAB — LIPASE: Lipase: 107 U/L (ref 73–393)

## 2017-05-09 MED ORDER — ONDANSETRON HCL 4 MG TAB
4 mg | ORAL_TABLET | Freq: Three times a day (TID) | ORAL | 0 refills | Status: AC | PRN
Start: 2017-05-09 — End: ?

## 2017-05-09 MED ORDER — MORPHINE 2 MG/ML INJECTION
2 mg/mL | INTRAMUSCULAR | Status: AC
Start: 2017-05-09 — End: 2017-05-08
  Administered 2017-05-09: 03:00:00 via INTRAVENOUS

## 2017-05-09 MED ORDER — ONDANSETRON (PF) 4 MG/2 ML INJECTION
4 mg/2 mL | INTRAMUSCULAR | Status: AC
Start: 2017-05-09 — End: 2017-05-08
  Administered 2017-05-09: 03:00:00 via INTRAVENOUS

## 2017-05-09 MED ORDER — SODIUM CHLORIDE 0.9% BOLUS IV
0.9 % | Freq: Once | INTRAVENOUS | Status: AC
Start: 2017-05-09 — End: 2017-05-08
  Administered 2017-05-09: 03:00:00 via INTRAVENOUS

## 2017-05-09 MED ORDER — DICYCLOMINE 20 MG TAB
20 mg | ORAL_TABLET | Freq: Four times a day (QID) | ORAL | 0 refills | Status: AC
Start: 2017-05-09 — End: 2017-05-13

## 2017-05-09 MED ORDER — IOPAMIDOL 61 % IV SOLN
300 mg iodine /mL (61 %) | Freq: Once | INTRAVENOUS | Status: AC
Start: 2017-05-09 — End: 2017-05-08
  Administered 2017-05-09: 03:00:00 via INTRAVENOUS

## 2017-05-09 MED ORDER — SODIUM CHLORIDE 0.9% BOLUS IV
0.9 % | Freq: Once | INTRAVENOUS | Status: DC
Start: 2017-05-09 — End: 2017-05-08

## 2017-05-09 MED FILL — ISOVUE-300  61 % INTRAVENOUS SOLUTION: 300 mg iodine /mL (61 %) | INTRAVENOUS | Qty: 100

## 2017-05-09 MED FILL — ONDANSETRON (PF) 4 MG/2 ML INJECTION: 4 mg/2 mL | INTRAMUSCULAR | Qty: 2

## 2017-05-09 MED FILL — MORPHINE 2 MG/ML INJECTION: 2 mg/mL | INTRAMUSCULAR | Qty: 1

## 2017-05-09 MED FILL — SODIUM CHLORIDE 0.9 % IV: INTRAVENOUS | Qty: 1000

## 2018-07-31 ENCOUNTER — Emergency Department
Admission: EM | Admit: 2018-07-31 | Discharge: 2018-07-31 | Disposition: A | Discharge status: Home / Self Care | Payer: Self-pay | Attending: Emergency Medicine | Admitting: Emergency Medicine

## 2018-07-31 ENCOUNTER — Encounter: Payer: Self-pay | Admitting: Emergency Medicine

## 2018-07-31 DIAGNOSIS — N760 Acute vaginitis: Secondary | ICD-10-CM | POA: Insufficient documentation

## 2018-07-31 DIAGNOSIS — F17210 Nicotine dependence, cigarettes, uncomplicated: Secondary | ICD-10-CM | POA: Insufficient documentation

## 2018-07-31 DIAGNOSIS — Z202 Contact with and (suspected) exposure to infections with a predominantly sexual mode of transmission: Secondary | ICD-10-CM | POA: Insufficient documentation

## 2018-07-31 DIAGNOSIS — Z79899 Other long term (current) drug therapy: Secondary | ICD-10-CM | POA: Insufficient documentation

## 2018-07-31 DIAGNOSIS — N3000 Acute cystitis without hematuria: Secondary | ICD-10-CM | POA: Insufficient documentation

## 2018-07-31 DIAGNOSIS — B9689 Other specified bacterial agents as the cause of diseases classified elsewhere: Secondary | ICD-10-CM | POA: Insufficient documentation

## 2018-07-31 LAB — WET PREP, GENITAL
SPERM: NONE SEEN
Trich, Wet Prep: NONE SEEN
YEAST WET PREP: NONE SEEN

## 2018-07-31 LAB — URINALYSIS, COMPLETE (UACMP) WITH MICROSCOPIC
Bilirubin Urine: NEGATIVE
GLUCOSE, UA: NEGATIVE mg/dL
KETONES UR: NEGATIVE mg/dL
NITRITE: NEGATIVE
PH: 6 (ref 5.0–8.0)
Protein, ur: NEGATIVE mg/dL
Specific Gravity, Urine: 1.015 (ref 1.005–1.030)

## 2018-07-31 LAB — CHLAMYDIA/NGC RT PCR (ARMC ONLY)
Chlamydia Tr: NOT DETECTED
N gonorrhoeae: NOT DETECTED

## 2018-07-31 LAB — POCT PREGNANCY, URINE: PREG TEST UR: NEGATIVE

## 2018-07-31 MED ORDER — AZITHROMYCIN 500 MG PO TABS
1000.0000 mg | ORAL_TABLET | Freq: Once | ORAL | Status: AC
  Administered 2018-07-31: 1000 mg via ORAL
  Filled 2018-07-31: qty 2

## 2018-07-31 MED ORDER — CEFTRIAXONE SODIUM 1 G IJ SOLR
1.0000 g | Freq: Once | INTRAMUSCULAR | Status: AC
  Administered 2018-07-31: 1 g via INTRAMUSCULAR
  Filled 2018-07-31: qty 10

## 2018-07-31 MED ORDER — CEPHALEXIN 500 MG PO CAPS: 500.0000 mg | ORAL_CAPSULE | Freq: Two times a day (BID) | ORAL | 0 refills | Status: AC

## 2018-07-31 MED ORDER — METRONIDAZOLE 500 MG PO TABS: 500.0000 mg | ORAL_TABLET | Freq: Two times a day (BID) | ORAL | 0 refills | Status: AC

## 2018-07-31 NOTE — ED Notes (Signed)
See triage note   Presents with some dysuria and vaginal itching and discharge .  States her sxs' started about 1 month ago  Also that her b/f was treat for [facility]

## 2018-07-31 NOTE — Discharge Instructions (Signed)
You can start the Flagyl tonight for your bacterial vaginosis.  You can start the Keflex tomorrow for your urinary tract infection.  All partners need to be tested and treated for STDs.

## 2018-07-31 NOTE — ED Triage Notes (Signed)
Patient presents to the ED for vaginal burning and itching, lower back pain and foul smelling white vaginal discharge.  Patient reports pain with urination.  Patient states her boyfriend was recently treated for chlamydia.

## 2018-07-31 NOTE — ED Provider Notes (Signed)
[facility] Emergency Department Provider Note  ____________________________________________  Time seen: Approximately 4:36 PM  I have reviewed the triage vital signs and the nursing notes.   HISTORY  Chief Complaint Dysuria and SEXUALLY TRANSMITTED DISEASE    HPI Veronica Hinton is a 28 y.o. female that presents emergency department for evaluation of vaginal itching, back pain, and white vaginal discharge for about 1 month.  Patient states her boyfriend was treated for gonorrhea and chlamydia but she was never treated.  Patient states that her and her boyfriend have been using protection since he was treated.  She states that she came to the emergency department today because she cannot stand the smell of the vaginal discharge any more. She has also been having some occasional dysuria as well.  No fever.   Past Medical History:  Diagnosis Date  . Thyroid disease     There are no active problems to display for this patient.   History reviewed. No pertinent surgical history.  Prior to Admission medications   Medication Sig Start Date End Date Taking? Authorizing Provider  Acetaminophen-Caff-Pyrilamine (MIDOL COMPLETE PO) Take 2 tablets by mouth daily as needed (pain).    [provider]  ALPRAZolam Prudy Feeler) 0.5 MG tablet Take 1 tablet (0.5 mg total) by mouth 2 (two) times daily as needed for anxiety. Patient not taking: Reported on 12/06/2014 05/09/14   Eber Hong, MD  cephALEXin (KEFLEX) 500 MG capsule Take 1 capsule (500 mg total) by mouth 2 (two) times daily for 10 days. 07/31/18 08/10/18  Enid Derry, PA-C  FLUoxetine (PROZAC) 40 MG capsule Take 40 mg by mouth daily.    [provider]  ibuprofen (ADVIL,MOTRIN) 200 MG tablet Take 200-400 mg by mouth every 6 (six) hours as needed for headache (pain).     [provider]  levothyroxine (SYNTHROID, LEVOTHROID) 125 MCG tablet Take 125 mcg by mouth daily before breakfast.     [provider]  metroNIDAZOLE (FLAGYL) 500 MG tablet Take 1 tablet (500 mg total) by mouth 2 (two) times daily for 7 days. 07/31/18 08/07/18  Enid Derry, PA-C  omeprazole (PRILOSEC) 20 MG capsule Take 1 capsule (20 mg total) by mouth daily. 12/06/14   Street, Mercedes, PA-C  ondansetron (ZOFRAN) 8 MG tablet Take 1 tablet (8 mg total) by mouth every 8 (eight) hours as needed for nausea or vomiting. 12/06/14   Street, [place], PA-C  polyethylene glycol (MIRALAX / GLYCOLAX) packet Take 17 g by mouth 2 (two) times daily. Take 17g PO BID until daily soft stools achieved, then adjust as needed to maintain daily soft stools 12/06/14   Street, [place], PA-C  traMADol (ULTRAM-ER) 200 MG 24 hr tablet Take 200 mg by mouth daily.    [provider]    Allergies Latex  No family history on file.  Social History Social History   Tobacco Use  . Smoking status: Current Every Day Smoker    Types: Cigarettes  . Smokeless tobacco: Never Used  Substance Use Topics  . Alcohol use: No  . Drug use: No    Types: Cocaine, Methamphetamines    Comment: Quit 2015     Review of Systems  Constitutional: No fever/chills Respiratory: No SOB. Gastrointestinal: No nausea, no vomiting.  Genitourinary: Positive for dysuria. Musculoskeletal: Positive for low back pain. Skin: Negative for rash, abrasions, lacerations, ecchymosis.   ____________________________________________   PHYSICAL EXAM:  VITAL SIGNS: ED Triage Vitals  Enc Vitals Group     BP 07/31/18 1538 126/82  Pulse Rate 07/31/18 1538 82     Resp 07/31/18 1538 18     Temp 07/31/18 1538 98.2 F (36.8 C)     Temp Source 07/31/18 1538 Oral     SpO2 07/31/18 1538 98 %     Weight 07/31/18 1539 170 lb (77.1 kg)     Height 07/31/18 1539 5\' 1"  (1.549 m)     Head Circumference --      Peak Flow --      Pain Score 07/31/18 1538 8     Pain Loc --      Pain Edu? --      Excl. in GC? --      Constitutional: Alert and oriented.  Well appearing and in no acute distress. Eyes: Conjunctivae are normal. PERRL. EOMI. Head: Atraumatic. ENT:      Ears:      Nose: No congestion/rhinnorhea.      Mouth/Throat: Mucous membranes are moist.  Neck: No stridor.  Cardiovascular: Normal rate, regular rhythm.  Good peripheral circulation. Respiratory: Normal respiratory effort without tachypnea or retractions. Lungs CTAB. Good air entry to the bases with no decreased or absent breath sounds. Gastrointestinal: Bowel sounds 4 quadrants. Soft and nontender to palpation. No guarding or rigidity. No palpable masses. No distention. No CVA tenderness. Genitourinary: No external rashes or lesions.  Thin white vaginal discharge.  No cervical motion tenderness. Musculoskeletal: Full range of motion to all extremities. No gross deformities appreciated. Neurologic:  Normal speech and language. No gross focal neurologic deficits are appreciated.  Skin:  Skin is warm, dry and intact. No rash noted. Psychiatric: Mood and affect are normal. Speech and behavior are normal. Patient exhibits appropriate insight and judgement.   ____________________________________________   LABS (all labs ordered are listed, but only abnormal results are displayed)  Labs Reviewed  WET PREP, GENITAL - Abnormal; Notable for the following components:      Result Value   Clue Cells Wet Prep HPF POC PRESENT (*)    WBC, Wet Prep HPF POC FEW (*)    All other components within normal limits  URINALYSIS, COMPLETE (UACMP) WITH MICROSCOPIC - Abnormal; Notable for the following components:   Color, Urine YELLOW (*)    APPearance CLOUDY (*)    Hgb urine dipstick SMALL (*)    Leukocytes,Ua LARGE (*)    Bacteria, UA RARE (*)    All other components within normal limits  CHLAMYDIA/NGC RT PCR ([facility] ONLY)  POC URINE PREG, ED  POCT PREGNANCY, URINE    ____________________________________________  EKG   ____________________________________________  RADIOLOGY   No results found.  ____________________________________________    PROCEDURES  Procedure(s) performed:    Procedures    Medications  cefTRIAXone (ROCEPHIN) injection 1 g (1 g Intramuscular Given 07/31/18 1741)  azithromycin (ZITHROMAX) tablet 1,000 mg (1,000 mg Oral Given 07/31/18 1743)     ____________________________________________   INITIAL IMPRESSION / ASSESSMENT AND PLAN / ED COURSE  Pertinent labs & imaging results that were available during my care of the patient were reviewed by me and considered in my medical decision making (see chart for details).  Review of the [place] CSRS was performed in accordance of the [facility] prior to dispensing any controlled drugs.   Patient's diagnosis is consistent with bacterial vaginosis, urinary tract infection, exposure to STD.  Patient was given 1 g of ceftriaxone to cover for urinary tract infection and STD.  She was also given a gram of azithromycin.  Patient will begin Flagyl tonight when she gets  home for bacterial vaginosis.  She will begin Keflex for urinary tract infection tomorrow.  Patient is aware that all partners need to be treated. Patient will be discharged home with prescriptions for Keflex and Flagyl. Patient is to follow up with primary care and health department as directed. Patient is given ED precautions to return to the ED for any worsening or new symptoms.     ____________________________________________  FINAL CLINICAL IMPRESSION(S) / ED DIAGNOSES  Final diagnoses:  Acute cystitis without hematuria  STD exposure  Bacterial vaginosis      NEW MEDICATIONS STARTED DURING THIS VISIT:  ED Discharge Orders         Ordered    cephALEXin (KEFLEX) 500 MG capsule  2 times daily     07/31/18 1735    metroNIDAZOLE (FLAGYL) 500 MG tablet  2 times daily     07/31/18 1735               This chart was dictated using voice recognition software/Dragon. Despite best efforts to proofread, errors can occur which can change the meaning. Any change was purely unintentional.    Enid Derry, PA-C 07/31/18 1806    Sharyn Creamer, MD 08/02/18 2136

## 2018-07-31 NOTE — ED Notes (Signed)
Pt discharged home after verbalizing understanding of discharge instructions; nad noted. 

## 2019-09-03 ENCOUNTER — Emergency Department
Admission: EM | Admit: 2019-09-03 | Discharge: 2019-09-03 | Disposition: A | Discharge status: Home / Self Care | Payer: Medicaid Other | Attending: Emergency Medicine | Admitting: Emergency Medicine

## 2019-09-03 ENCOUNTER — Encounter: Payer: Self-pay | Admitting: Emergency Medicine

## 2019-09-03 ENCOUNTER — Other Ambulatory Visit: Payer: Self-pay

## 2019-09-03 DIAGNOSIS — R05 Cough: Secondary | ICD-10-CM | POA: Diagnosis not present

## 2019-09-03 DIAGNOSIS — B349 Viral infection, unspecified: Secondary | ICD-10-CM | POA: Diagnosis not present

## 2019-09-03 DIAGNOSIS — J029 Acute pharyngitis, unspecified: Secondary | ICD-10-CM | POA: Diagnosis present

## 2019-09-03 DIAGNOSIS — F17210 Nicotine dependence, cigarettes, uncomplicated: Secondary | ICD-10-CM | POA: Diagnosis not present

## 2019-09-03 DIAGNOSIS — R1110 Vomiting, unspecified: Secondary | ICD-10-CM | POA: Diagnosis not present

## 2019-09-03 DIAGNOSIS — M7918 Myalgia, other site: Secondary | ICD-10-CM | POA: Insufficient documentation

## 2019-09-03 DIAGNOSIS — Z20822 Contact with and (suspected) exposure to covid-19: Secondary | ICD-10-CM | POA: Diagnosis not present

## 2019-09-03 LAB — INFLUENZA PANEL BY PCR (TYPE A & B)
Influenza A By PCR: NEGATIVE
Influenza B By PCR: NEGATIVE

## 2019-09-03 LAB — GROUP A STREP BY PCR: Group A Strep by PCR: NOT DETECTED

## 2019-09-03 LAB — POC SARS CORONAVIRUS 2 AG: SARS Coronavirus 2 Ag: NEGATIVE

## 2019-09-03 MED ORDER — LIDOCAINE VISCOUS HCL 2 % MT SOLN: 15.0000 mL | OROMUCOSAL | 0 refills | Status: DC | PRN

## 2019-09-03 MED ORDER — NAPROXEN 500 MG PO TABS: 500.0000 mg | ORAL_TABLET | Freq: Two times a day (BID) | ORAL | 0 refills | Status: DC

## 2019-09-03 MED ORDER — NAPROXEN 500 MG PO TABS
500.0000 mg | ORAL_TABLET | Freq: Once | ORAL | Status: AC
  Administered 2019-09-03: 500 mg via ORAL
  Filled 2019-09-03: qty 1

## 2019-09-03 NOTE — ED Triage Notes (Signed)
Pt ambulatory to triage.  Pt has sore throat, bodyaches, emesis.  Sx began yesterday. Cough today.  Pt alert, speech clear.

## 2019-09-03 NOTE — ED Notes (Signed)
Pt states she's been having burning eyes, can barely walk, has body aches, chest pain, shob, and n/v/d as well as her period being more heavy.

## 2019-09-03 NOTE — Discharge Instructions (Signed)
Your rapid Covid test is negative.  Your strep screen is negative, and your influenza test is negative as well.  You appear to have viral syndrome.  Take the Naprosyn for body aches.  Use the viscous lidocaine for sore throat.  Follow-up with your primary care provider or return to the emergency department for symptoms change or worsen if you are unable to schedule appointment.

## 2019-09-03 NOTE — ED Provider Notes (Signed)
[facility] Emergency Department Provider Note  ____________________________________________  Time seen: Approximately 6:54 PM  I have reviewed the triage vital signs and the nursing notes.   HISTORY  Chief Complaint Sore Throat    HPI Veronica Hinton is a 29 y.o. female who presents to the emergency department for treatment and evaluation of sore throat, bodyaches, emesis, cough, and heavy menses. Symptoms started yesterday.    Past Medical History:  Diagnosis Date  . Thyroid disease     There are no problems to display for this patient.   History reviewed. No pertinent surgical history.  Prior to Admission medications   Medication Sig Start Date End Date Taking? Authorizing Provider  Acetaminophen-Caff-Pyrilamine (MIDOL COMPLETE PO) Take 2 tablets by mouth daily as needed (pain).    [provider]  ALPRAZolam Duanne Moron) 0.5 MG tablet Take 1 tablet (0.5 mg total) by mouth 2 (two) times daily as needed for anxiety. Patient not taking: Reported on 12/06/2014 05/09/14   Noemi Chapel, MD  FLUoxetine (PROZAC) 40 MG capsule Take 40 mg by mouth daily.    [provider]  ibuprofen (ADVIL,MOTRIN) 200 MG tablet Take 200-400 mg by mouth every 6 (six) hours as needed for headache (pain).     [provider]  levothyroxine (SYNTHROID, LEVOTHROID) 125 MCG tablet Take 125 mcg by mouth daily before breakfast.    [provider]  lidocaine (XYLOCAINE) 2 % solution Use as directed 15 mLs in the mouth or throat as needed for mouth pain. 09/03/19   Jock Mahon, Johnette Abraham B, FNP  naproxen (NAPROSYN) 500 MG tablet Take 1 tablet (500 mg total) by mouth 2 (two) times daily with a meal. 09/03/19   Ravindra Baranek B, FNP  omeprazole (PRILOSEC) 20 MG capsule Take 1 capsule (20 mg total) by mouth daily. 12/06/14   Street, Mercedes, PA-C  ondansetron (ZOFRAN) 8 MG tablet Take 1 tablet (8 mg total) by mouth every 8 (eight) hours as needed for nausea or vomiting.  12/06/14   Street, [place], PA-C  polyethylene glycol (MIRALAX / GLYCOLAX) packet Take 17 g by mouth 2 (two) times daily. Take 17g PO BID until daily soft stools achieved, then adjust as needed to maintain daily soft stools 12/06/14   Street, [place], PA-C  traMADol (ULTRAM-ER) 200 MG 24 hr tablet Take 200 mg by mouth daily.    [provider]    Allergies Latex  No family history on file.  Social History Social History   Tobacco Use  . Smoking status: Current Every Day Smoker    Types: Cigarettes  . Smokeless tobacco: Never Used  Substance Use Topics  . Alcohol use: Yes  . Drug use: No    Types: Cocaine, Methamphetamines    Comment: Quit 2015    Review of Systems Constitutional: Negative for fever. Eyes: No visual changes. ENT: Positive for sore throat; negative for difficulty swallowing. Respiratory: Denies shortness of breath. Gastrointestinal: Negative for abdominal pain.  Positive for nausea and vomiting.  No diarrhea.  Genitourinary: Negative for dysuria.  Negative for decrease in need to void. Musculoskeletal: Positive for generalized body aches. Skin: Negative for rash. Neurological: Positive for headaches, negative for focal weakness or numbness.  ____________________________________________   PHYSICAL EXAM:  VITAL SIGNS: ED Triage Vitals  Enc Vitals Group     BP 09/03/19 1707 120/67     Pulse Rate 09/03/19 1707 96     Resp 09/03/19 1707 20     Temp 09/03/19 1707 98.6 F (37 C)  Temp Source 09/03/19 1707 Oral     SpO2 09/03/19 1707 99 %     Weight 09/03/19 1708 170 lb (77.1 kg)     Height 09/03/19 1708 5\' 2"  (1.575 m)     Head Circumference --      Peak Flow --      Pain Score 09/03/19 1708 10     Pain Loc --      Pain Edu? --      Excl. in GC? --     Constitutional: Alert and oriented. Well appearing and in no acute distress. Eyes: Conjunctivae are normal.  Head: Atraumatic. Nose: No congestion/rhinnorhea. Mouth/Throat: Mucous  membranes are moist.  Oropharynx erythematous, tonsils 1+ without exudate. Uvula is midline. Ears: Right tympanic membrane appears normal.  Left tympanic membrane appears normal. Neck: No stridor. Voice clear Lymphatic: Anterior cervical nodes nonpalpable Cardiovascular: Normal rate, regular rhythm. Good peripheral circulation. Respiratory: Normal respiratory effort. Lungs CTAB. Gastrointestinal: Soft and nontender. Musculoskeletal: FROM of neck, upper and lower extremities. Neurologic:  Normal speech and language. No gross focal neurologic deficits are appreciated. Skin:  Skin is warm, dry and intact.  No rash noted Psychiatric: Mood and affect are normal. Speech and behavior are normal.  ____________________________________________   LABS (all labs ordered are listed, but only abnormal results are displayed)  Labs Reviewed  GROUP A STREP BY PCR  INFLUENZA PANEL BY PCR (TYPE A & B)  POC SARS CORONAVIRUS 2 AG -  ED  POC SARS CORONAVIRUS 2 AG   ____________________________________________  EKG  Not indiated ____________________________________________  RADIOLOGY  Not indicated ____________________________________________   PROCEDURES  Procedure(s) performed: None  Critical Care performed: No ____________________________________________   INITIAL IMPRESSION / ASSESSMENT AND PLAN / ED COURSE  29 year old female presenting to the emergency department for treatment and evaluation of symptoms as described in the HPI.  Patient also states that she has been exposed to strep throat.  She states that her friend has it and she drank after her the other day.  She denies any known exposure to COVID-19.  Patient states she also feels like she may have "the flu."  Plan will be to get a strep screen, flu test, and Covid screening.  Rapid Covid, influenza, and strep screens are all normal.  Patient is afebrile, not tachycardic, normal SPO2, and normal blood pressure.  I do not think  she has COVID-19.  Pertinent labs & imaging results that were available during my care of the patient were reviewed by me and considered in my medical decision making (see chart for details). ____________________________________________  New Prescriptions   LIDOCAINE (XYLOCAINE) 2 % SOLUTION    Use as directed 15 mLs in the mouth or throat as needed for mouth pain.   NAPROXEN (NAPROSYN) 500 MG TABLET    Take 1 tablet (500 mg total) by mouth 2 (two) times daily with a meal.    FINAL CLINICAL IMPRESSION(S) / ED DIAGNOSES  Final diagnoses:  Viral illness    If controlled substance prescribed during this visit, 12 month history viewed on the [facility] prior to issuing an initial prescription for Schedule II or III opiod.   Note:  This document was prepared using Dragon voice recognition software and may include unintentional dictation errors.   37, FNP 09/03/19 2031    2032, MD 09/03/19 2123

## 2020-01-02 ENCOUNTER — Emergency Department: Payer: Medicaid Other

## 2020-01-02 ENCOUNTER — Emergency Department
Admission: EM | Admit: 2020-01-02 | Discharge: 2020-01-03 | Disposition: A | Discharge status: Home / Self Care | Payer: Medicaid Other | Attending: Emergency Medicine | Admitting: Emergency Medicine

## 2020-01-02 ENCOUNTER — Other Ambulatory Visit: Payer: Self-pay

## 2020-01-02 ENCOUNTER — Encounter: Payer: Self-pay | Admitting: Emergency Medicine

## 2020-01-02 DIAGNOSIS — F17210 Nicotine dependence, cigarettes, uncomplicated: Secondary | ICD-10-CM | POA: Diagnosis not present

## 2020-01-02 DIAGNOSIS — R0789 Other chest pain: Secondary | ICD-10-CM

## 2020-01-02 DIAGNOSIS — T405X1A Poisoning by cocaine, accidental (unintentional), initial encounter: Secondary | ICD-10-CM | POA: Insufficient documentation

## 2020-01-02 DIAGNOSIS — R109 Unspecified abdominal pain: Secondary | ICD-10-CM | POA: Insufficient documentation

## 2020-01-02 DIAGNOSIS — Z91040 Latex allergy status: Secondary | ICD-10-CM | POA: Insufficient documentation

## 2020-01-02 DIAGNOSIS — F1019 Alcohol abuse with unspecified alcohol-induced disorder: Secondary | ICD-10-CM | POA: Insufficient documentation

## 2020-01-02 DIAGNOSIS — F32A Depression, unspecified: Secondary | ICD-10-CM

## 2020-01-02 DIAGNOSIS — F333 Major depressive disorder, recurrent, severe with psychotic symptoms: Secondary | ICD-10-CM | POA: Diagnosis not present

## 2020-01-02 DIAGNOSIS — F1010 Alcohol abuse, uncomplicated: Secondary | ICD-10-CM

## 2020-01-02 DIAGNOSIS — Z20822 Contact with and (suspected) exposure to covid-19: Secondary | ICD-10-CM | POA: Insufficient documentation

## 2020-01-02 DIAGNOSIS — T405X2A Poisoning by cocaine, intentional self-harm, initial encounter: Secondary | ICD-10-CM

## 2020-01-02 LAB — URINE DRUG SCREEN, QUALITATIVE (ARMC ONLY)
Amphetamines, Ur Screen: NOT DETECTED
Barbiturates, Ur Screen: NOT DETECTED
Benzodiazepine, Ur Scrn: NOT DETECTED
Cannabinoid 50 Ng, Ur ~~LOC~~: POSITIVE — AB
Cocaine Metabolite,Ur ~~LOC~~: POSITIVE — AB
MDMA (Ecstasy)Ur Screen: NOT DETECTED
Methadone Scn, Ur: NOT DETECTED
Opiate, Ur Screen: NOT DETECTED
Phencyclidine (PCP) Ur S: NOT DETECTED
Tricyclic, Ur Screen: NOT DETECTED

## 2020-01-02 LAB — BASIC METABOLIC PANEL
Anion gap: 12 (ref 5–15)
BUN: 9 mg/dL (ref 6–20)
CO2: 25 mmol/L (ref 22–32)
Calcium: 9.4 mg/dL (ref 8.9–10.3)
Chloride: 102 mmol/L (ref 98–111)
Creatinine, Ser: 0.9 mg/dL (ref 0.44–1.00)
GFR calc Af Amer: >60 mL/min (ref >60)
GFR calc non Af Amer: >60 mL/min (ref >60)
Glucose, Bld: 107 mg/dL — ABNORMAL HIGH (ref 70–99)
Potassium: 4 mmol/L (ref 3.5–5.1)
Sodium: 139 mmol/L (ref 135–145)

## 2020-01-02 LAB — CBC
HCT: 44.2 % (ref 36.0–46.0)
Hemoglobin: 14.5 g/dL (ref 12.0–15.0)
MCH: 28.1 pg (ref 26.0–34.0)
MCHC: 32.8 g/dL (ref 30.0–36.0)
MCV: 85.7 fL (ref 80.0–100.0)
Platelets: 333 10*3/uL (ref 150–400)
RBC: 5.16 MIL/uL — ABNORMAL HIGH (ref 3.87–5.11)
RDW: 14.7 % (ref 11.5–15.5)
WBC: 12.4 10*3/uL — ABNORMAL HIGH (ref 4.0–10.5)
nRBC: 0 % (ref 0.0–0.2)

## 2020-01-02 LAB — URINALYSIS, ROUTINE W REFLEX MICROSCOPIC
Bacteria, UA: NONE SEEN
Bilirubin Urine: NEGATIVE
Glucose, UA: NEGATIVE mg/dL
Ketones, ur: NEGATIVE mg/dL
Nitrite: NEGATIVE
Protein, ur: NEGATIVE mg/dL
Specific Gravity, Urine: 1.024 (ref 1.005–1.030)
pH: 6 (ref 5.0–8.0)

## 2020-01-02 LAB — TROPONIN I (HIGH SENSITIVITY)
Troponin I (High Sensitivity): <2 ng/L (ref <18)
Troponin I (High Sensitivity): <2 ng/L (ref <18)

## 2020-01-02 LAB — SARS CORONAVIRUS 2 BY RT PCR (HOSPITAL ORDER, PERFORMED IN ~~LOC~~ HOSPITAL LAB): SARS Coronavirus 2: NEGATIVE

## 2020-01-02 LAB — TSH: TSH: 67.286 u[IU]/mL — ABNORMAL HIGH (ref 0.350–4.500)

## 2020-01-02 LAB — ETHANOL: Alcohol, Ethyl (B): <10 mg/dL (ref <10)

## 2020-01-02 LAB — POCT PREGNANCY, URINE: Preg Test, Ur: NEGATIVE

## 2020-01-02 LAB — LIPASE, BLOOD: Lipase: 24 U/L (ref 11–51)

## 2020-01-02 MED ORDER — QUETIAPINE FUMARATE 25 MG PO TABS
100.0000 mg | ORAL_TABLET | Freq: Every day | ORAL | Status: DC
  Administered 2020-01-02: 100 mg via ORAL
  Filled 2020-01-02: qty 4

## 2020-01-02 MED ORDER — LEVOTHYROXINE SODIUM 50 MCG PO TABS: 125.0000 ug | ORAL_TABLET | Freq: Once | ORAL | Status: DC

## 2020-01-02 MED ORDER — SODIUM CHLORIDE 0.9% FLUSH: 3.0000 mL | Freq: Once | INTRAVENOUS | Status: DC

## 2020-01-02 MED ORDER — LEVOTHYROXINE SODIUM 75 MCG PO TABS
125.0000 ug | ORAL_TABLET | Freq: Every day | ORAL | Status: DC
  Administered 2020-01-03: 125 ug via ORAL
  Filled 2020-01-02 (×2): qty 2

## 2020-01-02 MED ORDER — CALCIUM CARBONATE ANTACID 500 MG PO CHEW
2.0000 | CHEWABLE_TABLET | Freq: Once | ORAL | Status: AC
  Administered 2020-01-02: 400 mg via ORAL
  Filled 2020-01-02: qty 2

## 2020-01-02 NOTE — Consult Note (Addendum)
[facility] Face-to-Face Psychiatry Consult   Reason for Consult:  S/I Referring Physician:  Dr. Katrinka Blazing Patient Identification: Veronica Hinton MRN:  [id number] Principal Diagnosis: <principal problem not specified> Diagnosis:  Active Problems:   * No active hospital problems. 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  Says that she asked her godmother to bring her to the emergency department due to worsening depression over the past month as well as suicidal ideations.  She did try to overdose on a large amount of cocaine yesterday.  He had disclosed this to her sister but no other family members.  She has 1 get hospitalization 15 years ago.  States that she was on trazodone and Seroquel 15 years ago but has been off of these medications because she has not established care.  She has a lot around the years and has been in [place] for 1 year.  Says that the suicidal ideations "comes and go."  Appetite is low.  She struggles with sleep and energy level.  She denies psychosis.  When asked about mania she is fairly vague, however and endorses severe mood swings and verbal anger with some property destruction.  Says that she had been using Xanax off the street at one point but no longer.  Says that she last used cocaine 15 years ago but had tried it again yesterday.  She uses cannabis as well as tobacco.  Also, indicates that she drinks alcohol in excess 2 out of 7 days of the week to the point where she becomes intoxicated and passes out.  States that she has hypothyroidism and stopped her medication 4 months ago because she felt it was not working.  Past Psychiatric History: 1 [facility]  admission.  Risk to Self:   Risk to Others:   Prior Inpatient Therapy:   Prior Outpatient Therapy:    Past Medical History:  Past Medical History:  Diagnosis Date  . Thyroid disease    History reviewed. No pertinent surgical history. Family History: No family history on file. Family Psychiatric  History: unknown Social History:  Social History   Substance and Sexual Activity  Alcohol Use Yes     Social History   Substance and Sexual Activity  Drug Use No  . Types: Cocaine, Methamphetamines   Comment: Quit 2015    Social History   Socioeconomic History  . Marital status: Single    Spouse name: Not on file  . Number of children: Not on file  . Years of education: Not on file  . Highest education level: Not on file  Occupational History  . Not on file  Tobacco Use  . Smoking status: Current Every Day Smoker    Types: Cigarettes  . Smokeless tobacco: Never Used  Substance and Sexual Activity  . Alcohol use: Yes  . Drug use: No    Types: Cocaine, Methamphetamines    Comment: Quit 2015  . Sexual activity: Yes    Birth control/protection: None  Other Topics Concern  . Not on file  Social History Narrative  . Not on file   Social Determinants of Health   Financial Resource Strain:   . Difficulty of Paying Living Expenses:   Food Insecurity:   .  Worried About Programme researcher, broadcasting/film/videounning Out of Food in the Last Year:   . Baristaan Out of Food in the Last Year:   Transportation Needs:   . Freight forwarderLack of Transportation (Medical):   Marland Kitchen. Lack of Transportation (Non-Medical):   Physical Activity:   . Days of Exercise per Week:   . Minutes of Exercise per Session:   Stress:   . Feeling of Stress :   Social Connections:   . Frequency of Communication with Friends and Family:   . Frequency of Social Gatherings with Friends and Family:   . Attends Religious Services:   . Active Member of Clubs or Organizations:   . Attends BankerClub or Organization Meetings:   Marland Kitchen. Marital Status:    Additional Social  History:    Allergies:   Allergies  Allergen Reactions  . Latex Rash    Labs:  Results for orders placed or performed during the hospital encounter of 01/02/20 (from the past 48 hour(s))  Basic metabolic panel     Status: Abnormal   Collection Time: 01/02/20  9:28 AM  Result Value Ref Range   Sodium 139 135 - 145 mmol/L   Potassium 4.0 3.5 - 5.1 mmol/L   Chloride 102 98 - 111 mmol/L   CO2 25 22 - 32 mmol/L   Glucose, Bld 107 (H) 70 - 99 mg/dL    Comment: Glucose reference range applies only to samples taken after fasting for at least 8 hours.   BUN 9 6 - 20 mg/dL   Creatinine, Ser 1.610.90 0.44 - 1.00 mg/dL   Calcium 9.4 8.9 - 09.610.3 mg/dL   GFR calc non Af Amer >60 >60 mL/min   GFR calc Af Amer >60 >60 mL/min   Anion gap 12 5 - 15    Comment: Performed at [facility], [address] [place], [place] [address]  CBC     Status: Abnormal   Collection Time: 01/02/20  9:28 AM  Result Value Ref Range   WBC 12.4 (H) 4.0 - 10.5 K/uL   RBC 5.16 (H) 3.87 - 5.11 MIL/uL   Hemoglobin 14.5 12.0 - 15.0 g/dL   HCT 98.144.2 36 - 46 %   MCV 85.7 80.0 - 100.0 fL   MCH 28.1 26.0 - 34.0 pg   MCHC 32.8 30.0 - 36.0 g/dL   RDW 19.114.7 47.811.5 - 29.515.5 %   Platelets 333 150 - 400 K/uL   nRBC 0.0 0.0 - 0.2 %    Comment: Performed at [facility], [address] [place], [place] [address]  Troponin I (High Sensitivity)     Status: None   Collection Time: 01/02/20  9:28 AM  Result Value Ref Range   Troponin I (High Sensitivity) <2 <18 ng/L    Comment: (NOTE) Elevated high sensitivity troponin I (hsTnI) values and significant  changes across serial measurements may suggest ACS but many other  chronic and acute conditions are known to elevate hsTnI results.  Refer to the "Links" section for chest pain algorithms and additional  guidance. Performed at [facility], [address] [place], [place] [address]   Lipase, blood     Status: None   Collection Time: 01/02/20  9:28 AM   Result Value Ref Range   Lipase 24 11 - 51 U/L    Comment: Performed at [facility], [address] [place], [place] [address]  Urinalysis, Routine w reflex microscopic     Status: Abnormal   Collection Time: 01/02/20 11:22 AM  Result  Value Ref Range   Color, Urine YELLOW (A) YELLOW   APPearance CLOUDY (A) CLEAR   Specific Gravity, Urine 1.024 1.005 - 1.030   pH 6.0 5.0 - 8.0   Glucose, UA NEGATIVE NEGATIVE mg/dL   Hgb urine dipstick SMALL (A) NEGATIVE   Bilirubin Urine NEGATIVE NEGATIVE   Ketones, ur NEGATIVE NEGATIVE mg/dL   Protein, ur NEGATIVE NEGATIVE mg/dL   Nitrite NEGATIVE NEGATIVE   Leukocytes,Ua MODERATE (A) NEGATIVE   RBC / HPF 0-5 0 - 5 RBC/hpf   WBC, UA 0-5 0 - 5 WBC/hpf   Bacteria, UA NONE SEEN NONE SEEN   Squamous Epithelial / LPF 11-20 0 - 5   Mucus PRESENT     Comment: Performed at [facility], [address] [place], [place] [address]  Urine Drug Screen, Qualitative ([facility] only)     Status: Abnormal   Collection Time: 01/02/20 11:22 AM  Result Value Ref Range   Tricyclic, Ur Screen NONE DETECTED NONE DETECTED   Amphetamines, Ur Screen NONE DETECTED NONE DETECTED   MDMA (Ecstasy)Ur Screen NONE DETECTED NONE DETECTED   Cocaine Metabolite,Ur [place] POSITIVE (A) NONE DETECTED   Opiate, Ur Screen NONE DETECTED NONE DETECTED   Phencyclidine (PCP) Ur S NONE DETECTED NONE DETECTED   Cannabinoid 50 Ng, Ur [place] POSITIVE (A) NONE DETECTED   Barbiturates, Ur Screen NONE DETECTED NONE DETECTED   Benzodiazepine, Ur Scrn NONE DETECTED NONE DETECTED   Methadone Scn, Ur NONE DETECTED NONE DETECTED    Comment: (NOTE) Tricyclics + metabolites, urine    Cutoff 1000 ng/mL Amphetamines + metabolites, urine  Cutoff 1000 ng/mL MDMA (Ecstasy), urine              Cutoff 500 ng/mL Cocaine Metabolite, urine          Cutoff 300 ng/mL Opiate + metabolites, urine        Cutoff 300 ng/mL Phencyclidine (PCP), urine         Cutoff 25 ng/mL Cannabinoid, urine                  Cutoff 50 ng/mL Barbiturates + metabolites, urine  Cutoff 200 ng/mL Benzodiazepine, urine              Cutoff 200 ng/mL Methadone, urine                   Cutoff 300 ng/mL  The urine drug screen provides only a preliminary, unconfirmed analytical test result and should not be used for non-medical purposes. Clinical consideration and professional judgment should be applied to any positive drug screen result due to possible interfering substances. A more specific alternate chemical method must be used in order to obtain a confirmed analytical result. Gas chromatography / mass spectrometry (GC/MS) is the preferred confirm atory method. Performed at [facility], [address] [place], [place] [address]   Pregnancy, urine POC     Status: None   Collection Time: 01/02/20 11:29 AM  Result Value Ref Range   Preg Test, Ur NEGATIVE NEGATIVE    Comment:        THE SENSITIVITY OF THIS METHODOLOGY IS >24 mIU/mL   Troponin I (High Sensitivity)     Status: None   Collection Time: 01/02/20 11:42 AM  Result Value Ref Range   Troponin I (High Sensitivity) <2 <18 ng/L    Comment: (NOTE) Elevated high sensitivity troponin I (hsTnI) values and significant  changes across serial measurements may suggest ACS but many other  chronic  and acute conditions are known to elevate hsTnI results.  Refer to the "Links" section for chest pain algorithms and additional  guidance. Performed at [facility], [address] [place], [place] [address]   Ethanol     Status: None   Collection Time: 01/02/20 11:42 AM  Result Value Ref Range   Alcohol, Ethyl (B) <10 <10 mg/dL    Comment: (NOTE) Lowest detectable limit for serum alcohol is 10 mg/dL.  For medical purposes only. Performed at [facility], [address] [place], [place] [address]     Current Facility-Administered Medications  Medication Dose Route Frequency Provider Last Rate Last Admin  . sodium chloride  flush (NS) 0.9 % injection 3 mL  3 mL Intravenous Once Delton Prairie, MD       Current Outpatient Medications  Medication Sig Dispense Refill  . Acetaminophen-Caff-Pyrilamine (MIDOL COMPLETE PO) Take 2 tablets by mouth daily as needed (pain).    Marland Kitchen ALPRAZolam (XANAX) 0.5 MG tablet Take 1 tablet (0.5 mg total) by mouth 2 (two) times daily as needed for anxiety. (Patient not taking: Reported on 12/06/2014) 15 tablet 0  . FLUoxetine (PROZAC) 40 MG capsule Take 40 mg by mouth daily.    Marland Kitchen ibuprofen (ADVIL,MOTRIN) 200 MG tablet Take 200-400 mg by mouth every 6 (six) hours as needed for headache (pain).     Marland Kitchen levothyroxine (SYNTHROID, LEVOTHROID) 125 MCG tablet Take 125 mcg by mouth daily before breakfast.    . lidocaine (XYLOCAINE) 2 % solution Use as directed 15 mLs in the mouth or throat as needed for mouth pain. 120 mL 0  . naproxen (NAPROSYN) 500 MG tablet Take 1 tablet (500 mg total) by mouth 2 (two) times daily with a meal. 30 tablet 0  . omeprazole (PRILOSEC) 20 MG capsule Take 1 capsule (20 mg total) by mouth daily. 7 capsule 0  . ondansetron (ZOFRAN) 8 MG tablet Take 1 tablet (8 mg total) by mouth every 8 (eight) hours as needed for nausea or vomiting. 10 tablet 0  . polyethylene glycol (MIRALAX / GLYCOLAX) packet Take 17 g by mouth 2 (two) times daily. Take 17g PO BID until daily soft stools achieved, then adjust as needed to maintain daily soft stools 24 each 0  . traMADol (ULTRAM-ER) 200 MG 24 hr tablet Take 200 mg by mouth daily.      Musculoskeletal:Psychiatric Specialty Exam: Physical Exam  Review of Systems    General Appearance: Well Groomed  Eye Contact:  Good  Speech:  Negative  Volume:  Normal  Mood:  Depresion  Affect:  Congruent  Thought Process:  Coherent  Orientation:  Full (Time, Place, and Person)  Thought Content:  Negative  Suicidal Thoughts:  No  Homicidal Thoughts:  No  Memory:  Negative  Judgement:  poor  Insight:  poor  Psychomotor Activity:  Normal   Concentration:  Concentration: Good  Recall:  Good  Fund of Knowledge:  Good  Language:  Good  Akathisia:  Negative  Handed:  Right  AIMS (if indicated):     Assets:  Physical Health  ADL's:  Intact  Cognition:  WNL  Sleep:        Treatment Plan Summary: Patient was seen and assessed We will obtain a TSH level We will place patient on involuntary commitment for her safety Her sister is caring for her kids at this time We will admit the patient to a psychiatric unit Start patient on Seroquel 100 mg p.o. nightly Labs and tests  reviewed Risk benefits side effects and alternative reviewed including tardive dyskinesia, EPS NMS weight gain sedation    Disposition: Recommend psychiatric Inpatient admission when medically cleared.  Reggie Pile, MD 01/02/2020 2:46 PM

## 2020-01-02 NOTE — ED Notes (Signed)
Pt given dinner tray and sprite.  

## 2020-01-02 NOTE — ED Notes (Signed)
Pt. Transferred to [facility] from ED to room 4 after screening for contraband. Report to include Situation, Background, Assessment and Recommendations from Ann RN. Pt. Oriented to unit including Q15 minute rounds as well as the security cameras for their protection. Patient is alert and oriented, warm and dry in no acute distress. Patient denies SI, HI, and AVH. Pt. Encouraged to let me know if needs arise. 

## 2020-01-02 NOTE — ED Triage Notes (Signed)
Pt c/o severe generalized CP and Abdominal pain x 2 days after eating rotten coconut. Pt states has hx of hypothyroidism and has not been taking medications. Pt A&O x4.   Pt also now stating in triage SI without plan. Pt states "i've been trying to push them (the thoughts) away but I just can't". Pt noted to be tearful in triage at this time .

## 2020-01-02 NOTE — ED Notes (Signed)
Pt given meal tray.

## 2020-01-02 NOTE — ED Notes (Signed)
Pineapple tank top Black pants Pink crocs jewelry in specimen cup  Keys  Cell phone and charger   lw edt

## 2020-01-02 NOTE — ED Notes (Signed)
Pt given crackers.  

## 2020-01-02 NOTE — BH Assessment (Signed)
Assessment Note  Veronica Hinton is an 29 y.o. female. Pt presented to the ED voluntarily c/o severe chest/abdominal pain and having suicidal thoughts, however she was IVC'd by the psych MD. Patient was calm and cooperative, and lying quietly on her bed upon this writer's arrival.  Pt reported that she was brought in by her adoptive mother as she has experienced significant changes in her mental status over the last month.  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 Patient denied current SI but stated the thoughts come and go. The patient reported that she'd attempted to overdose on cocaine the day before and she'd consumed 3 bootlegger drinks. The patient reported that she's attempted overdose on another occasion 5-6 years ago. The patient also admitted to marijuana use. The patient explained that she drinks heavily on at least 2 nights per week. Patient reported a previous psychiatric admission 15 years ago. Pt states she is linked to [facility] for outpatient services and is scheduled there on Thursday. The patient reported that she'd been on previously been prescribed psych meds however she has not taken meds in years. The patient reported that she has had increased anger, verbal aggression, and irritability over the past month. The patient explained that she yells and screams constantly, and frequently threatens others. The patient reported that she and her children reside with her sister. Patient reported that she has poor sleep. Patient also reported a decreased appetite, as she hadn't eaten in two days. Denies any hallucinations, suicidal/homicidal ideations, or paranoia. Patient also stated that she has low self-esteem and would not be around if it weren't for her children.   Diagnosis: Major Depressive Disorder, Recurrent, Severe with mixed features  Past Medical History:  Past Medical History:   Diagnosis Date  . Thyroid disease     History reviewed. No pertinent surgical history.  Family History: No family history on file.  Social History:  reports that she has been smoking cigarettes. She has never used smokeless tobacco. She reports current alcohol use. She reports that she does not use drugs.  Additional Social History:  Alcohol / Drug Use Pain Medications: See PTA Prescriptions: See PTA History of alcohol / drug use?: Yes Negative Consequences of Use: Personal relationships Withdrawal Symptoms: Aggressive/Assaultive, Agitation Substance #1 Name of Substance 1: Alcohol Substance #2 Name of Substance 2: Marijuana Substance #3 Name of Substance 3: Cocaine  CIWA: CIWA-Ar BP: 112/74 Pulse Rate: 83 COWS:    Allergies:  Allergies  Allergen Reactions  . Latex Rash    Home Medications: (Not in a hospital admission)   OB/GYN Status:  No LMP recorded.  General Assessment Data Location of Assessment: [facility] ED TTS Assessment: In system Is this a Tele or Face-to-Face Assessment?: Face-to-Face Is this an Initial Assessment or a Re-assessment for this encounter?: Initial Assessment Patient Accompanied by:: N/A Language Other than English: No Living Arrangements: Other (Comment) What gender do you identify as?: Female Date Telepsych consult ordered in [facility]: 01/02/20 Time Telepsych consult ordered in [facility]: 1327 Marital status: Single Maiden name: n/a Pregnancy Status: No Living Arrangements: Children, Other relatives Can pt return to current living arrangement?: Yes Admission Status: Involuntary Petitioner: ED Attending Is patient capable of signing voluntary admission?: Yes Referral Source: Self/Family/Friend  Medical Screening Exam [facility] Walk-in ONLY) Medical Exam completed: Yes  Crisis Care Plan Living Arrangements: Children, Other relatives Legal Guardian: Other: (Self) Name of Psychiatrist:  [facility] Name of Therapist: [facility]  Education  Status Is patient currently in school?: No Is the patient employed, unemployed or receiving disability?: Employed  Risk to self with the past 6 months Suicidal Ideation: No Has patient been a risk to self within the past 6 months prior to admission? : Yes Suicidal Intent: No Has patient had any suicidal intent within the past 6 months prior to admission? : Yes Is patient at risk for suicide?: No Suicidal Plan?: No Has patient had any suicidal plan within the past 6 months prior to admission? : No Access to Means: No What has been your use of drugs/alcohol within the last 12 months?: Heavy drinker; cocaine and marijuana Previous Attempts/Gestures: Yes How many times?: 2 Other Self Harm Risks: Increased depression Triggers for Past Attempts: None known Intentional Self Injurious Behavior: None Family Suicide History: Unknown Recent stressful life event(s): Conflict (Comment), Legal Issues, Trauma (Comment) Persecutory voices/beliefs?: No Depression: Yes Depression Symptoms: Tearfulness, Feeling angry/irritable Substance abuse history and/or treatment for substance abuse?: Yes Suicide prevention information given to non-admitted patients: Not applicable  Risk to Others within the past 6 months Homicidal Ideation: No Does patient have any lifetime risk of violence toward others beyond the six months prior to admission? : No Thoughts of Harm to Others: No Current Homicidal Intent: No Current Homicidal Plan: No Access to Homicidal Means: No Identified Victim: n/a History of harm to others?: No Assessment of Violence: None Noted Violent Behavior Description: n/a Does patient have access to weapons?: No Criminal Charges Pending?: No Does patient have a court date: No Is patient on probation?: No  Psychosis Hallucinations: None noted Delusions: None noted  Mental Status Report Appearance/Hygiene: Disheveled, Poor hygiene Eye Contact: Good Motor Activity: Freedom of  movement Speech: Logical/coherent Level of Consciousness: Alert Mood: Anxious, Depressed, Despair Affect: Anxious Anxiety Level: Minimal Thought Processes: Coherent, Relevant Judgement: Impaired Orientation: Person, Place, Time, Situation Obsessive Compulsive Thoughts/Behaviors: None  Cognitive Functioning Concentration: Normal Memory: Recent Intact, Remote Intact Is patient IDD: No Insight: Poor Impulse Control: Poor Appetite: Poor Have you had any weight changes? : No Change Sleep: Decreased Vegetative Symptoms: None  ADLScreening [facility] Assessment Services) Patient's cognitive ability adequate to safely complete daily activities?: Yes Patient able to express need for assistance with ADLs?: Yes Independently performs ADLs?: Yes (appropriate for developmental age)  Prior Inpatient Therapy Prior Inpatient Therapy: Yes Prior Therapy Dates:  (Many years ago) Prior Therapy Facilty/Provider(s): n/a Reason for Treatment: n/a  Prior Outpatient Therapy Prior Outpatient Therapy: No Does patient have an ACCT team?: No Does patient have Intensive In-House Services?  : No Does patient have [facility] services? : No Does patient have P4CC services?: No  ADL Screening (condition at time of admission) Patient's cognitive ability adequate to safely complete daily activities?: Yes Is the patient deaf or have difficulty hearing?: No Does the patient have difficulty seeing, even when wearing glasses/contacts?: No Does the patient have difficulty concentrating, remembering, or making decisions?: No Patient able to express need for assistance with ADLs?: Yes Does the patient have difficulty dressing or bathing?: No Independently performs ADLs?: Yes (appropriate for developmental age) Does the patient have difficulty walking or climbing stairs?: No Weakness of Legs: None Weakness of Arms/Hands: None  Home Assistive Devices/Equipment Home Assistive Devices/Equipment: None  Therapy  Consults (therapy consults require a physician order) PT Evaluation Needed: No OT Evalulation Needed: No SLP Evaluation Needed: No   Values / Beliefs Cultural Requests During Hospitalization: None Consults Spiritual Care Consult Needed: No Transition  of Care Team Consult Needed: No Advance Directives (For Healthcare) Does Patient Have a Medical Advance Directive?: No Would patient like information on creating a medical advance directive?: No - Patient declined          Disposition: Per psych MD, Patient is recommend psychiatric Inpatient admission when medically cleared.  Disposition Initial Assessment Completed for this Encounter: Yes  On Site Evaluation by:   Reviewed with Physician:    Foy Guadalajara 01/02/2020 3:44 PM

## 2020-01-02 NOTE — ED Notes (Signed)
Hourly rounding reveals patient sleeping in room. No complaints, stable, in no acute distress. Q15 minute rounds and monitoring via [employer] to continue. 

## 2020-01-02 NOTE — ED Notes (Signed)
Psych at bedside.

## 2020-01-02 NOTE — BHH Counselor (Signed)
Pt information faxed to [facility], [place] and [place].Marland KitchenMarland Kitchen

## 2020-01-02 NOTE — ED Notes (Signed)
Pt has not been taking her thyroid meds due to being depressed. Pt thyroid swollen.

## 2020-01-02 NOTE — ED Provider Notes (Signed)
[facility] Emergency Department Provider Note ____________________________________________   First MD Initiated Contact with Patient 01/02/20 1116     (approximate)  I have reviewed the triage vital signs and the nursing notes.  HISTORY  Chief Complaint Chest Pain, Abdominal Pain, and Psychiatric Evaluation   HPI Veronica Hinton is a 29 y.o. female does the ED for evaluation of chest pain and suicidality.  Chart review indicates history of hypothyroidism on Synthroid.  Patient reports multiple months of declining depressive symptoms and mental status.  She reports psychosocial stressors to include her 3 children at home and unpleasant job.  She reports "coming to a breaking point" yesterday, and "snorting a bunch of cocaine" and "drinking too many bootleggers" in an attempt to end her life.  She reports she "failed" when she woke up this morning, and so called her mother who brought her into the emergency room.  Patient reports chest pain and epigastric pain beginning last night when she was ingesting cocaine, it has been waning and improving throughout the day today.  She denies any chest pain at this time.  She denies syncope, cough, recent fevers or illnesses.  She reports regret for her suicide attempt, and cites her 3 children as motivating factors to get better.    Past Medical History:  Diagnosis Date   Thyroid disease     There are no problems to display for this patient.   History reviewed. No pertinent surgical history.  Prior to Admission medications   Medication Sig Start Date End Date Taking? Authorizing Provider  Acetaminophen-Caff-Pyrilamine (MIDOL COMPLETE PO) Take 2 tablets by mouth daily as needed (pain).    [provider]  ALPRAZolam Prudy Feeler) 0.5 MG tablet Take 1 tablet (0.5 mg total) by mouth 2 (two) times daily as needed for anxiety. Patient not taking: Reported on 12/06/2014 05/09/14   Eber Hong, MD  FLUoxetine  (PROZAC) 40 MG capsule Take 40 mg by mouth daily.    [provider]  ibuprofen (ADVIL,MOTRIN) 200 MG tablet Take 200-400 mg by mouth every 6 (six) hours as needed for headache (pain).     [provider]  levothyroxine (SYNTHROID, LEVOTHROID) 125 MCG tablet Take 125 mcg by mouth daily before breakfast.    [provider]  lidocaine (XYLOCAINE) 2 % solution Use as directed 15 mLs in the mouth or throat as needed for mouth pain. 09/03/19   Triplett, Rulon Eisenmenger B, FNP  naproxen (NAPROSYN) 500 MG tablet Take 1 tablet (500 mg total) by mouth 2 (two) times daily with a meal. 09/03/19   Triplett, Cari B, FNP  omeprazole (PRILOSEC) 20 MG capsule Take 1 capsule (20 mg total) by mouth daily. 12/06/14   Street, Mercedes, PA-C  ondansetron (ZOFRAN) 8 MG tablet Take 1 tablet (8 mg total) by mouth every 8 (eight) hours as needed for nausea or vomiting. 12/06/14   Street, [place], PA-C  polyethylene glycol (MIRALAX / GLYCOLAX) packet Take 17 g by mouth 2 (two) times daily. Take 17g PO BID until daily soft stools achieved, then adjust as needed to maintain daily soft stools 12/06/14   Street, Annetta, PA-C  traMADol (ULTRAM-ER) 200 MG 24 hr tablet Take 200 mg by mouth daily.    [provider]    Allergies Latex  No family history on file.  Social History Social History   Tobacco Use   Smoking status: Current Every Day Smoker    Types: Cigarettes   Smokeless tobacco: Never Used  Substance Use Topics  Alcohol use: Yes   Drug use: No    Types: Cocaine, Methamphetamines    Comment: Quit 2015    Review of Systems  Constitutional: No fever/chills Eyes: No visual changes. ENT: No sore throat. Cardiovascular: Denies chest pain. Respiratory: Denies shortness of breath. Gastrointestinal: No abdominal pain.  No nausea, no vomiting.  No diarrhea.  No constipation. Genitourinary: Negative for dysuria. Musculoskeletal: Negative for back pain. Skin: Negative for  rash. Neurological: Negative for headaches, focal weakness or numbness. Psychiatric: Positive for suicidality  ____________________________________________   PHYSICAL EXAM:  VITAL SIGNS: Vitals:   01/02/20 0937  BP: 112/74  Pulse: 83  Resp: 20  Temp: 98.4 F (36.9 C)  SpO2: 96%      Constitutional: Alert and oriented. Well appearing and in no acute distress. Eyes: Conjunctivae are normal. PERRL. EOMI. Head: Atraumatic. Nose: No congestion/rhinnorhea. Mouth/Throat: Mucous membranes are moist.  Oropharynx non-erythematous. Neck: No stridor. No cervical spine tenderness to palpation. Cardiovascular: Normal rate, regular rhythm. Grossly normal heart sounds.  Good peripheral circulation. Respiratory: Normal respiratory effort.  No retractions. Lungs CTAB. Gastrointestinal: Soft , nondistended, nontender to palpation. No abdominal bruits. No CVA tenderness. Musculoskeletal: No lower extremity tenderness nor edema.  No joint effusions. No signs of acute trauma. Neurologic:  Normal speech and language. No gross focal neurologic deficits are appreciated. No gait instability noted. Skin:  Skin is warm, dry and intact. No rash noted. Psychiatric: Mood and affect are normal. Speech and behavior are normal.  ____________________________________________   LABS (all labs ordered are listed, but only abnormal results are displayed)  Labs Reviewed  BASIC METABOLIC PANEL - Abnormal; Notable for the following components:      Result Value   Glucose, Bld 107 (*)    All other components within normal limits  CBC - Abnormal; Notable for the following components:   WBC 12.4 (*)    RBC 5.16 (*)    All other components within normal limits  URINALYSIS, ROUTINE W REFLEX MICROSCOPIC - Abnormal; Notable for the following components:   Color, Urine YELLOW (*)    APPearance CLOUDY (*)    Hgb urine dipstick SMALL (*)    Leukocytes,Ua MODERATE (*)    All other components within normal limits   URINE DRUG SCREEN, QUALITATIVE ([facility] ONLY) - Abnormal; Notable for the following components:   Cocaine Metabolite,Ur [place] POSITIVE (*)    Cannabinoid 50 Ng, Ur [place] POSITIVE (*)    All other components within normal limits  TSH - Abnormal; Notable for the following components:   TSH 67.286 (*)    All other components within normal limits  LIPASE, BLOOD  ETHANOL  POC URINE PREG, ED  POCT PREGNANCY, URINE  TROPONIN I (HIGH SENSITIVITY)  TROPONIN I (HIGH SENSITIVITY)   ____________________________________________  12 Lead EKG Sinus rhythm, rate of 79 bpm, normal axis and intervals.  No evidence of acute ischemia.  ____________________________________________  RADIOLOGY  ED MD interpretation: CXR without evidence of acute cardiopulmonary pathology  Official radiology report(s): DG Chest 2 View  Result Date: 01/02/2020 CLINICAL DATA:  Chest pain EXAM: CHEST - 2 VIEW COMPARISON:  None. FINDINGS: The heart size and mediastinal contours are within normal limits. Both lungs are clear. The visualized skeletal structures are unremarkable. IMPRESSION: No active cardiopulmonary disease. Electronically Signed   By: Duanne Guess D.O.   On: 01/02/2020 12:27    ____________________________________________   PROCEDURES and INTERVENTIONS  Procedure(s) performed (including Critical Care):  Procedures  Medications  sodium chloride flush (NS) 0.9 %  injection 3 mL (3 mLs Intravenous Not Given 01/02/20 1111)  QUEtiapine (SEROQUEL) tablet 100 mg (has no administration in time range)  levothyroxine (SYNTHROID) tablet 125 mcg (has no administration in time range)  calcium carbonate (TUMS - dosed in mg elemental calcium) chewable tablet 400 mg of elemental calcium (400 mg of elemental calcium Oral Given 01/02/20 1428)    ____________________________________________   INITIAL IMPRESSION / ASSESSMENT AND PLAN / ED COURSE  29 year old woman presenting for evaluation with chest pain after failed  suicide attempt with cocaine and alcohol overdosing, without evidence of cardiac ischemia and amenable to psychiatric disposition.  Normal vital signs room air.  Exam with a now-pleasant patient who expresses regret and motivating factors at home of her children.  She is currently chest pain-free and has no evidence of distress or neurovascular deficits.  Nonischemic EKG and low high-sensitivity troponin.  She does report not taking any of her medications recently, including her Synthroid, TSH indicates undertreated hypothyroidism.  Her home dose of Synthroid was provided to her.  Patient has no medical conditions to preclude her psychiatric assessment and disposition.  Psychiatry has assessed the patient and she has been IVC'd.  Disposition per psychiatric assessment, likely inpatient placement.      ____________________________________________   FINAL CLINICAL IMPRESSION(S) / ED DIAGNOSES  Final diagnoses:  Suicide attempt by cocaine overdose (HCC)  Alcohol abuse  Other chest pain  Acute depression     ED Discharge Orders    None       Brandilynn Taormina   Note:  This document was prepared using Dragon voice recognition software and may include unintentional dictation errors.   Delton Prairie, MD 01/02/20 [phone]

## 2020-01-02 NOTE — ED Notes (Signed)
Pt concerned about child care. TTS at bedside to determine plan while pt under IVC. Pt given phone to talk to family.

## 2020-01-03 LAB — T4, FREE: Free T4: 0.35 ng/dL — ABNORMAL LOW (ref 0.61–1.12)

## 2020-01-03 MED ORDER — QUETIAPINE FUMARATE 100 MG PO TABS: 100.0000 mg | ORAL_TABLET | Freq: Every day | ORAL | 0 refills | Status: DC

## 2020-01-03 MED ORDER — CLOTRIMAZOLE 1 % VA CREA
1.0000 | TOPICAL_CREAM | Freq: Every day | VAGINAL | Status: DC
  Filled 2020-01-03: qty 45

## 2020-01-03 MED ORDER — CLOTRIMAZOLE 1 % VA CREA: 1.0000 | TOPICAL_CREAM | Freq: Every day | VAGINAL | 0 refills | Status: AC

## 2020-01-03 MED ORDER — LEVOTHYROXINE SODIUM 125 MCG PO TABS: 125.0000 ug | ORAL_TABLET | Freq: Every day | ORAL | 0 refills | Status: DC

## 2020-01-03 NOTE — Consult Note (Addendum)
[facility] Face-to-Face Psychiatry Consult   Reason for Consult:  S/I Referring Physician:  Dr. Katrinka Blazing Patient Identification: Veronica Hinton MRN:  [id number] Principal Diagnosis: <principal problem not specified> Diagnosis:  Active Problems:   * No active hospital problems. 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  She did sleep well last night with the Seroquel 100 mg that was started here.  She gets done well on this medication in the past.  She was started on the Synthroid by the emergency room doctor.  She complained of a yeast infection and medication was prescribed as well.  Emotionally, says that she is not feeling suicidal homicidal.  She spoke to her kids and misses them.  She says that there is no childcare tomorrow because her mother will be working.  I did speak to her mother who confirms this issue.  Also states that she has had ups and downs but fairly mild.  Mother did not know of any suicidal thoughts recently or using excessive cocaine to harm herself.  Patient feels remorseful for this.  Again cites her kids as protective factors once again.  The patient develop suicidal homicidal ideation please call 911 or go to local emergency department.  Patient voices agreement and understanding.  Past Psychiatric History: 1 [facility] admission.  Risk to Self: Suicidal Ideation: No Suicidal Intent: No Is patient at risk for suicide?: No Suicidal Plan?: No Access to Means: No What has been your use of drugs/alcohol within the last 12 months?: Heavy drinker; cocaine and marijuana How many times?: 2 Other Self Harm Risks: Increased depression Triggers for Past Attempts: None known Intentional Self Injurious Behavior: None Risk to Others: Homicidal Ideation:  No Thoughts of Harm to Others: No Current Homicidal Intent: No Current Homicidal Plan: No Access to Homicidal Means: No Identified Victim: n/a History of harm to others?: No Assessment of Violence: None Noted Violent Behavior Description: n/a Does patient have access to weapons?: No Criminal Charges Pending?: No Does patient have a court date: No Prior Inpatient Therapy: Prior Inpatient Therapy: Yes Prior Therapy Dates:  (Many years ago) Prior Therapy Facilty/Provider(s): n/a Reason for Treatment: n/a Prior Outpatient Therapy: Prior Outpatient Therapy: No Does patient have an ACCT team?: No Does patient have Intensive In-House Services?  : No Does patient have [facility] services? : No Does patient have P4CC services?: No  Past Medical History:  Past Medical History:  Diagnosis Date  . Thyroid disease    History reviewed. No pertinent surgical history. Family History: No family history on file. Family Psychiatric  History: unknown Social History:  Social History   Substance and Sexual Activity  Alcohol Use Yes     Social History   Substance and Sexual Activity  Drug Use No  . Types: Cocaine, Methamphetamines   Comment: Quit 2015    Social History   Socioeconomic History  . Marital status: Single    Spouse name: Not on file  . Number of children: Not on file  . Years of education: Not on file  . Highest education level: Not on file  Occupational History  . Not on file  Tobacco Use  . Smoking status: Current Every Day Smoker    Types: Cigarettes  . Smokeless tobacco: Never Used  Substance and Sexual Activity  . Alcohol  use: Yes  . Drug use: No    Types: Cocaine, Methamphetamines    Comment: Quit 2015  . Sexual activity: Yes    Birth control/protection: None  Other Topics Concern  . Not on file  Social History Narrative  . Not on file   Social Determinants of Health   Financial Resource Strain:   . Difficulty of Paying Living Expenses:   Food  Insecurity:   . Worried About Programme researcher, broadcasting/film/video in the Last Year:   . Barista in the Last Year:   Transportation Needs:   . Freight forwarder (Medical):   Marland Kitchen Lack of Transportation (Non-Medical):   Physical Activity:   . Days of Exercise per Week:   . Minutes of Exercise per Session:   Stress:   . Feeling of Stress :   Social Connections:   . Frequency of Communication with Friends and Family:   . Frequency of Social Gatherings with Friends and Family:   . Attends Religious Services:   . Active Member of Clubs or Organizations:   . Attends Banker Meetings:   Marland Kitchen Marital Status:    Additional Social History:    Allergies:   Allergies  Allergen Reactions  . Latex Rash    Labs:  Results for orders placed or performed during the hospital encounter of 01/02/20 (from the past 48 hour(s))  Basic metabolic panel     Status: Abnormal   Collection Time: 01/02/20  9:28 AM  Result Value Ref Range   Sodium 139 135 - 145 mmol/L   Potassium 4.0 3.5 - 5.1 mmol/L   Chloride 102 98 - 111 mmol/L   CO2 25 22 - 32 mmol/L   Glucose, Bld 107 (H) 70 - 99 mg/dL    Comment: Glucose reference range applies only to samples taken after fasting for at least 8 hours.   BUN 9 6 - 20 mg/dL   Creatinine, Ser 1.19 0.44 - 1.00 mg/dL   Calcium 9.4 8.9 - 14.7 mg/dL   GFR calc non Af Amer >60 >60 mL/min   GFR calc Af Amer >60 >60 mL/min   Anion gap 12 5 - 15    Comment: Performed at [facility], [address] [place], [place] [address]  CBC     Status: Abnormal   Collection Time: 01/02/20  9:28 AM  Result Value Ref Range   WBC 12.4 (H) 4.0 - 10.5 K/uL   RBC 5.16 (H) 3.87 - 5.11 MIL/uL   Hemoglobin 14.5 12.0 - 15.0 g/dL   HCT 21.3 36 - 46 %   MCV 85.7 80.0 - 100.0 fL   MCH 28.1 26.0 - 34.0 pg   MCHC 32.8 30.0 - 36.0 g/dL   RDW 08.6 57.8 - 46.9 %   Platelets 333 150 - 400 K/uL   nRBC 0.0 0.0 - 0.2 %    Comment: Performed at [facility], [address] [place], [place] [address]  Troponin I (High Sensitivity)     Status: None   Collection Time: 01/02/20  9:28 AM  Result Value Ref Range   Troponin I (High Sensitivity) <2 <18 ng/L    Comment: (NOTE) Elevated high sensitivity troponin I (hsTnI) values and significant  changes across serial measurements may suggest ACS but many other  chronic and acute conditions are known to elevate hsTnI results.  Refer to the "Links" section for chest pain algorithms and additional  guidance. Performed at [facility], [phone] [place], [place] [address]   Lipase, blood     Status: None   Collection Time: 01/02/20  9:28 AM  Result Value Ref Range   Lipase 24 11 - 51 U/L    Comment: Performed at [facility], [address] [place], [place] [address]  Urinalysis, Routine w reflex microscopic     Status: Abnormal   Collection Time: 01/02/20 11:22 AM  Result Value Ref Range   Color, Urine YELLOW (A) YELLOW   APPearance CLOUDY (A) CLEAR   Specific Gravity, Urine 1.024 1.005 - 1.030   pH 6.0 5.0 - 8.0   Glucose, UA NEGATIVE NEGATIVE mg/dL   Hgb urine dipstick SMALL (A) NEGATIVE   Bilirubin Urine NEGATIVE NEGATIVE   Ketones, ur NEGATIVE NEGATIVE mg/dL   Protein, ur NEGATIVE NEGATIVE mg/dL   Nitrite NEGATIVE NEGATIVE   Leukocytes,Ua MODERATE (A) NEGATIVE   RBC / HPF 0-5 0 - 5 RBC/hpf   WBC, UA 0-5 0 - 5 WBC/hpf   Bacteria, UA NONE SEEN NONE SEEN   Squamous Epithelial / LPF 11-20 0 - 5   Mucus PRESENT     Comment: Performed at [facility], [address], [place], [place] [address]  Urine Drug Screen, Qualitative ([facility] only)     Status: Abnormal   Collection Time: 01/02/20 11:22 AM  Result Value Ref Range   Tricyclic, Ur Screen NONE DETECTED NONE DETECTED   Amphetamines, Ur Screen NONE DETECTED NONE DETECTED   MDMA (Ecstasy)Ur Screen NONE DETECTED NONE DETECTED   Cocaine Metabolite,Ur [place] POSITIVE (A) NONE DETECTED   Opiate, Ur Screen NONE DETECTED NONE  DETECTED   Phencyclidine (PCP) Ur S NONE DETECTED NONE DETECTED   Cannabinoid 50 Ng, Ur  POSITIVE (A) NONE DETECTED   Barbiturates, Ur Screen NONE DETECTED NONE DETECTED   Benzodiazepine, Ur Scrn NONE DETECTED NONE DETECTED   Methadone Scn, Ur NONE DETECTED NONE DETECTED    Comment: (NOTE) Tricyclics + metabolites, urine    Cutoff 1000 ng/mL Amphetamines + metabolites, urine  Cutoff 1000 ng/mL MDMA (Ecstasy), urine              Cutoff 500 ng/mL Cocaine Metabolite, urine          Cutoff 300 ng/mL Opiate + metabolites, urine        Cutoff 300 ng/mL Phencyclidine (PCP), urine         Cutoff 25 ng/mL Cannabinoid, urine                 Cutoff 50 ng/mL Barbiturates + metabolites, urine  Cutoff 200 ng/mL Benzodiazepine, urine              Cutoff 200 ng/mL Methadone, urine                   Cutoff 300 ng/mL  The urine drug screen provides only a preliminary, unconfirmed analytical test result and should not be used for non-medical purposes. Clinical consideration and professional judgment should be applied to any positive drug screen result due to possible interfering substances. A more specific alternate chemical method must be used in order to obtain a confirmed analytical result. Gas chromatography / mass spectrometry (GC/MS) is the preferred confirm atory method. Performed at [facility], [address] [place], [place] [address]   Pregnancy, urine POC     Status: None   Collection Time: 01/02/20 11:29 AM  Result Value Ref Range   Preg Test, Ur NEGATIVE NEGATIVE    Comment:  THE SENSITIVITY OF THIS METHODOLOGY IS >24 mIU/mL   TSH     Status: Abnormal   Collection Time: 01/02/20 11:37 AM  Result Value Ref Range   TSH 67.286 (H) 0.350 - 4.500 uIU/mL    Comment: Performed by a 3rd Generation assay with a functional sensitivity of <=0.01 uIU/mL. Performed at [facility], [address] [place], [place] [address]   Troponin I (High Sensitivity)      Status: None   Collection Time: 01/02/20 11:42 AM  Result Value Ref Range   Troponin I (High Sensitivity) <2 <18 ng/L    Comment: (NOTE) Elevated high sensitivity troponin I (hsTnI) values and significant  changes across serial measurements may suggest ACS but many other  chronic and acute conditions are known to elevate hsTnI results.  Refer to the "Links" section for chest pain algorithms and additional  guidance. Performed at [facility], [address] [place], [place] [address]   Ethanol     Status: None   Collection Time: 01/02/20 11:42 AM  Result Value Ref Range   Alcohol, Ethyl (B) <10 <10 mg/dL    Comment: (NOTE) Lowest detectable limit for serum alcohol is 10 mg/dL.  For medical purposes only. Performed at [facility], [address] [place], [place] [address]   SARS Coronavirus 2 by RT PCR (hospital order, performed in [facility] hospital lab) Nasopharyngeal Nasopharyngeal Swab     Status: None   Collection Time: 01/02/20  8:52 PM   Specimen: Nasopharyngeal Swab  Result Value Ref Range   SARS Coronavirus 2 NEGATIVE NEGATIVE    Comment: (NOTE) SARS-CoV-2 target nucleic acids are NOT DETECTED.  The SARS-CoV-2 RNA is generally detectable in upper and lower respiratory specimens during the acute phase of infection. The lowest concentration of SARS-CoV-2 viral copies this assay can detect is 250 copies / mL. A negative result does not preclude SARS-CoV-2 infection and should not be used as the sole basis for treatment or other patient management decisions.  A negative result may occur with improper specimen collection / handling, submission of specimen other than nasopharyngeal swab, presence of viral mutation(s) within the areas targeted by this assay, and inadequate number of viral copies (<250 copies / mL). A negative result must be combined with clinical observations, patient history, and epidemiological information.  Fact Sheet for  Patients:   BoilerBrush.com.cyhttps://www.fda.gov/media/136312/download  Fact Sheet for Healthcare Providers: https://pope.com/https://www.fda.gov/media/136313/download  This test is not yet approved or  cleared by the [place] FDA and has been authorized for detection and/or diagnosis of SARS-CoV-2 by FDA under an Emergency Use Authorization (EUA).  This EUA will remain in effect (meaning this test can be used) for the duration of the COVID-19 declaration under Section 564(b)(1) of the Act, 21 U.S.C. section 360bbb-3(b)(1), unless the authorization is terminated or revoked sooner.  Performed at [facility], [address] [place], [place] [address]     Current Facility-Administered Medications  Medication Dose Route Frequency Provider Last Rate Last Admin  . clotrimazole (GYNE-LOTRIMIN) vaginal cream 1 Applicatorful  1 Applicatorful Vaginal QHS Arnaldo NatalMalinda, Paul F, MD      . levothyroxine (SYNTHROID) tablet 125 mcg  125 mcg Oral Q0600 Shaune PollackIsaacs, Cameron, MD   125 mcg at 01/03/20 [phone]  . QUEtiapine (SEROQUEL) tablet 100 mg  100 mg Oral QHS Reggie PileJoshi, Javonda Suh, MD   100 mg at 01/02/20 2121  . sodium chloride flush (NS) 0.9 % injection 3 mL  3 mL Intravenous Once Delton PrairieSmith, Dylan, MD  Current Outpatient Medications  Medication Sig Dispense Refill  . Acetaminophen-Caff-Pyrilamine (MIDOL COMPLETE PO) Take 2 tablets by mouth daily as needed (pain).    Marland Kitchen ALPRAZolam (XANAX) 0.5 MG tablet Take 1 tablet (0.5 mg total) by mouth 2 (two) times daily as needed for anxiety. (Patient not taking: Reported on 12/06/2014) 15 tablet 0  . FLUoxetine (PROZAC) 40 MG capsule Take 40 mg by mouth daily.    Marland Kitchen ibuprofen (ADVIL,MOTRIN) 200 MG tablet Take 200-400 mg by mouth every 6 (six) hours as needed for headache (pain).     Marland Kitchen levothyroxine (SYNTHROID, LEVOTHROID) 125 MCG tablet Take 125 mcg by mouth daily before breakfast.    . lidocaine (XYLOCAINE) 2 % solution Use as directed 15 mLs in the mouth or throat as needed for mouth pain. 120 mL  0  . naproxen (NAPROSYN) 500 MG tablet Take 1 tablet (500 mg total) by mouth 2 (two) times daily with a meal. 30 tablet 0  . omeprazole (PRILOSEC) 20 MG capsule Take 1 capsule (20 mg total) by mouth daily. 7 capsule 0  . ondansetron (ZOFRAN) 8 MG tablet Take 1 tablet (8 mg total) by mouth every 8 (eight) hours as needed for nausea or vomiting. 10 tablet 0  . polyethylene glycol (MIRALAX / GLYCOLAX) packet Take 17 g by mouth 2 (two) times daily. Take 17g PO BID until daily soft stools achieved, then adjust as needed to maintain daily soft stools 24 each 0  . traMADol (ULTRAM-ER) 200 MG 24 hr tablet Take 200 mg by mouth daily.      Musculoskeletal:Psychiatric Specialty Exam: Physical Exam  Review of Systems    General Appearance: Well Groomed  Eye Contact:  Good  Speech:  Negative  Volume:  Normal  Mood:  Depresion  Affect:  Congruent  Thought Process:  Coherent  Orientation:  Full (Time, Place, and Person)  Thought Content:  Negative  Suicidal Thoughts:  No  Homicidal Thoughts:  No  Memory:  Negative  Judgement:  poor  Insight:  poor  Psychomotor Activity:  Normal  Concentration:  Concentration: Good  Recall:  Good  Fund of Knowledge:  Good  Language:  Good  Akathisia:  Negative  Handed:  Right  AIMS (if indicated):     Assets:  Physical Health  ADL's:  Intact  Cognition:  WNL  Sleep:        Treatment Plan Summary: Patient was seen and assessed We will obtain a TSH level Her sister is caring for her kids at this time We will admit the patient to a psychiatric unit Start patient on Seroquel 100 mg p.o. nightly Labs and tests reviewed Risk benefits side effects and alternative reviewed including tardive dyskinesia, EPS NMS weight gain sedation  01/03/20 Will drop IVC TSH is 67 obtain Free t3 t4 Collateral information obtained from her mother She is not an imminent risk to herself or others.  She is not hypomanic or manic. Patient will be discharged on Seroquel,  lamotrigine and antibiotic She has a follow-up next Thursday with outpatient psychiatry Patient will follow-up with her primary care provider for her thyroid issues If the patient develops suicidal or homicidal please call 911 or go to local emergency department.  Patient voices agreement and understanding   Disposition: Recommend psychiatric Inpatient admission when medically cleared.  Reggie Pile, MD 01/03/2020 1:45 PM

## 2020-01-03 NOTE — ED Notes (Signed)
Pt given meal tray.

## 2020-01-03 NOTE — ED Notes (Signed)
Hourly rounding reveals patient sleeping in room. No complaints, stable, in no acute distress. Q15 minute rounds and monitoring via [employer] to continue. 

## 2020-01-03 NOTE — Discharge Instructions (Addendum)
Please follow-up with your psychiatrist as planned.  Follow-up with your primary care doctor as well and make sure you are taking your medicines as directed.  This includes your thyroid supplement.  Please return for any further problems.

## 2020-01-03 NOTE — ED Notes (Signed)
Pt complaints of vaginal itching and possible yeast infection. MD notified and meds ordered.

## 2020-01-03 NOTE — ED Notes (Signed)
Psychiatrist at bedside updating patient.

## 2020-01-05 LAB — T3, FREE: T3, Free: 1.4 pg/mL — ABNORMAL LOW (ref 2.0–4.4)

## 2020-03-09 ENCOUNTER — Emergency Department: Payer: Medicaid Other

## 2020-03-09 ENCOUNTER — Emergency Department
Admission: EM | Admit: 2020-03-09 | Discharge: 2020-03-09 | Disposition: A | Discharge status: Home / Self Care | Payer: Medicaid Other | Attending: Emergency Medicine | Admitting: Emergency Medicine

## 2020-03-09 ENCOUNTER — Other Ambulatory Visit: Payer: Self-pay

## 2020-03-09 DIAGNOSIS — Z91040 Latex allergy status: Secondary | ICD-10-CM | POA: Insufficient documentation

## 2020-03-09 DIAGNOSIS — S60221A Contusion of right hand, initial encounter: Secondary | ICD-10-CM | POA: Insufficient documentation

## 2020-03-09 DIAGNOSIS — Z79899 Other long term (current) drug therapy: Secondary | ICD-10-CM | POA: Diagnosis not present

## 2020-03-09 DIAGNOSIS — Y93F2 Activity, caregiving, lifting: Secondary | ICD-10-CM | POA: Insufficient documentation

## 2020-03-09 DIAGNOSIS — X509XXA Other and unspecified overexertion or strenuous movements or postures, initial encounter: Secondary | ICD-10-CM | POA: Diagnosis not present

## 2020-03-09 DIAGNOSIS — F17210 Nicotine dependence, cigarettes, uncomplicated: Secondary | ICD-10-CM | POA: Diagnosis not present

## 2020-03-09 DIAGNOSIS — S60921A Unspecified superficial injury of right hand, initial encounter: Secondary | ICD-10-CM | POA: Diagnosis present

## 2020-03-09 MED ORDER — NAPROXEN 375 MG PO TABS: 375.0000 mg | ORAL_TABLET | Freq: Two times a day (BID) | ORAL | 0 refills | Status: DC

## 2020-03-09 NOTE — ED Provider Notes (Signed)
[facility] Emergency Department Provider Note   ____________________________________________   First MD Initiated Contact with Patient 03/09/20 [phone]     (approximate)  I have reviewed the triage vital signs and the nursing notes.   HISTORY  Chief Complaint right hand injury    HPI Veronica Hinton is a 29 y.o. female patient complain of right hand pain secondary to being contused by door last night.  Patient denies loss sensation.  Patient states pain increased with grasping or lifting.  Patient states she was unable to comb daughter's hair this morning secondary to pain.  Patient rates pain as a 10/10.  Patient described pain is "achy".  No palliative measure prior to arrival.  Patient is right-hand dominant.      Past Medical History:  Diagnosis Date  . Thyroid disease     There are no problems to display for this patient.   No past surgical history on file.  Prior to Admission medications   Medication Sig Start Date End Date Taking? Authorizing Provider  levothyroxine (SYNTHROID) 125 MCG tablet Take 1 tablet (125 mcg total) by mouth daily at 6 (six) AM. 01/04/20   Reggie Pile, MD  naproxen (NAPROSYN) 375 MG tablet Take 1 tablet (375 mg total) by mouth 2 (two) times daily with a meal. 03/09/20   Joni Reining, PA-C  QUEtiapine (SEROQUEL) 100 MG tablet Take 1 tablet (100 mg total) by mouth at bedtime. 01/03/20   Reggie Pile, MD    Allergies Latex  No family history on file.  Social History Social History   Tobacco Use  . Smoking status: Current Every Day Smoker    Types: Cigarettes  . Smokeless tobacco: Never Used  Substance Use Topics  . Alcohol use: Yes  . Drug use: No    Types: Cocaine, Methamphetamines    Comment: Quit 2015    Review of Systems Constitutional: No fever/chills Eyes: No visual changes. ENT: No sore throat. Cardiovascular: Denies chest pain. Respiratory: Denies shortness of breath. Gastrointestinal: No  abdominal pain.  No nausea, no vomiting.  No diarrhea.  No constipation. Genitourinary: Negative for dysuria. Musculoskeletal: Right hand pain. Skin: Negative for rash. Neurological: Negative for headaches, focal weakness or numbness. Endocrine:  Hypothyroidism. Hematological/Lymphatic:  Allergic/Immunilogical: **}  ____________________________________________   PHYSICAL EXAM:  VITAL SIGNS: ED Triage Vitals  Enc Vitals Group     BP 03/09/20 0903 96/60     Pulse Rate 03/09/20 0903 68     Resp 03/09/20 0903 18     Temp 03/09/20 0903 98.2 F (36.8 C)     Temp Source 03/09/20 0903 Oral     SpO2 03/09/20 0903 100 %     Weight 03/09/20 0838 230 lb (104.3 kg)     Height 03/09/20 0838 5\' 1"  (1.549 m)     Head Circumference --      Peak Flow --      Pain Score 03/09/20 0838 10     Pain Loc --      Pain Edu? --      Excl. in GC? --    Constitutional: Alert and oriented. Well appearing and in no acute distress. Cardiovascular: Normal rate, regular rhythm. Grossly normal heart sounds.  Good peripheral circulation. Respiratory: Normal respiratory effort.  No retractions. Lungs CTAB. Musculoskeletal: No obvious deformity to the right hand.  Moderate edema to the dorsal aspect of the right hand.. Neurologic:  Normal speech and language. No gross focal neurologic deficits are appreciated. No gait instability.  Skin:  Skin is warm, dry and intact. No rash noted.  Abrasion dorsal aspect the right hand. Psychiatric: Mood and affect are normal. Speech and behavior are normal.  ____________________________________________   LABS (all labs ordered are listed, but only abnormal results are displayed)  Labs Reviewed - No data to display ____________________________________________  EKG   ____________________________________________  RADIOLOGY I, Joni Reining, personally viewed and evaluated these images (plain radiographs) as part of my medical decision making, as well as reviewing  the written report by the radiologist.  ED MD interpretation: No acute findings. Official radiology report(s): DG Hand Complete Right  Result Date: 03/09/2020 CLINICAL DATA:  Injury last night closed hand in vault door, having pain, abrasion and swelling at posterior aspect of RIGHT hand at metacarpal area EXAM: RIGHT HAND - COMPLETE 3+ VIEW COMPARISON:  None FINDINGS: Osseous mineralization normal. Joint spaces preserved. No fracture, dislocation, or bone destruction. Flexion deformity of RIGHT little finger at PIP joint, reportedly chronic. Minimal dorsal soft tissue swelling overlying hand. IMPRESSION: No acute osseous abnormalities. Electronically Signed   By: Ulyses Southward M.D.   On: 03/09/2020 09:06    ____________________________________________   PROCEDURES  Procedure(s) performed (including Critical Care):  Procedures   ____________________________________________   INITIAL IMPRESSION / ASSESSMENT AND PLAN / ED COURSE  As part of my medical decision making, I reviewed the following data within the electronic MEDICAL RECORD NUMBER       Patient presents with right hand pain secondary to contusion.  Discussed x-ray findings with patient.  Patient given discharge care instruction.  Patient hand was Ace wrapped prior to departure.  Patient given a work note and advised take medication as directed.  Return right ED if condition worsens.       ____________________________________________   FINAL CLINICAL IMPRESSION(S) / ED DIAGNOSES  Final diagnoses:  Contusion of right hand, initial encounter     ED Discharge Orders         Ordered    naproxen (NAPROSYN) 375 MG tablet  2 times daily with meals        03/09/20 0934          *Please note:  [name] was evaluated in Emergency Department on 03/09/2020 for the symptoms described in the history of present illness. She was evaluated in the context of the global COVID-19 pandemic, which necessitated consideration that  the patient might be at risk for infection with the SARS-CoV-2 virus that causes COVID-19. Institutional protocols and algorithms that pertain to the evaluation of patients at risk for COVID-19 are in a state of rapid change based on information released by regulatory bodies including the CDC and federal and state organizations. These policies and algorithms were followed during the patient's care in the ED.  Some ED evaluations and interventions may be delayed as a result of limited staffing during and the pandemic.*   Note:  This document was prepared using Dragon voice recognition software and may include unintentional dictation errors.    Joni Reining, PA-C 03/09/20 1914    Merwyn Katos, MD 03/09/20 1049

## 2020-03-09 NOTE — ED Notes (Signed)
Pt taken to triage 2 for assessment by Ron, PA-C.  

## 2020-03-09 NOTE — ED Triage Notes (Signed)
Pt states she closed right hand in a door. Pt able to move all fingers. Pt states injury happened last night.

## 2020-03-09 NOTE — Discharge Instructions (Signed)
Follow discharge care instructions were is ready for 2 to 3 days as needed.

## 2020-03-09 NOTE — ED Notes (Signed)
NAD noted at time of D/C. Pt denies questions or concerns. Pt ambulatory to the lobby at this time.    Ace wrap applied by this RN to patient's R hand, CMS intact after application of Ace Wrap. Pt tolerated well. Safety precautions reviewed with patient.   Verbal consent for D/C obtained by this RN at this time.

## 2020-11-21 ENCOUNTER — Encounter: Payer: Self-pay | Admitting: Emergency Medicine

## 2020-11-21 ENCOUNTER — Emergency Department: Payer: Medicaid Other

## 2020-11-21 ENCOUNTER — Emergency Department
Admission: EM | Admit: 2020-11-21 | Discharge: 2020-11-21 | Disposition: A | Discharge status: Home / Self Care | Payer: Medicaid Other | Attending: Emergency Medicine | Admitting: Emergency Medicine

## 2020-11-21 ENCOUNTER — Other Ambulatory Visit: Payer: Self-pay

## 2020-11-21 DIAGNOSIS — Z3A01 Less than 8 weeks gestation of pregnancy: Secondary | ICD-10-CM | POA: Diagnosis not present

## 2020-11-21 DIAGNOSIS — Z79899 Other long term (current) drug therapy: Secondary | ICD-10-CM | POA: Insufficient documentation

## 2020-11-21 DIAGNOSIS — R110 Nausea: Secondary | ICD-10-CM

## 2020-11-21 DIAGNOSIS — F17210 Nicotine dependence, cigarettes, uncomplicated: Secondary | ICD-10-CM | POA: Diagnosis not present

## 2020-11-21 DIAGNOSIS — O4690 Antepartum hemorrhage, unspecified, unspecified trimester: Secondary | ICD-10-CM

## 2020-11-21 DIAGNOSIS — R1031 Right lower quadrant pain: Secondary | ICD-10-CM | POA: Diagnosis not present

## 2020-11-21 DIAGNOSIS — O208 Other hemorrhage in early pregnancy: Secondary | ICD-10-CM | POA: Diagnosis not present

## 2020-11-21 DIAGNOSIS — Z91040 Latex allergy status: Secondary | ICD-10-CM | POA: Insufficient documentation

## 2020-11-21 DIAGNOSIS — R42 Dizziness and giddiness: Secondary | ICD-10-CM

## 2020-11-21 LAB — URINALYSIS, COMPLETE (UACMP) WITH MICROSCOPIC
Bilirubin Urine: NEGATIVE
Glucose, UA: NEGATIVE mg/dL
Ketones, ur: NEGATIVE mg/dL
Leukocytes,Ua: NEGATIVE
Nitrite: NEGATIVE
Protein, ur: NEGATIVE mg/dL
Specific Gravity, Urine: 1.017 (ref 1.005–1.030)
pH: 6 (ref 5.0–8.0)

## 2020-11-21 LAB — COMPREHENSIVE METABOLIC PANEL
ALT: 11 U/L (ref 0–44)
AST: 14 U/L — ABNORMAL LOW (ref 15–41)
Albumin: 4.1 g/dL (ref 3.5–5.0)
Alkaline Phosphatase: 44 U/L (ref 38–126)
Anion gap: 5 (ref 5–15)
BUN: 12 mg/dL (ref 6–20)
CO2: 23 mmol/L (ref 22–32)
Calcium: 8.5 mg/dL — ABNORMAL LOW (ref 8.9–10.3)
Chloride: 105 mmol/L (ref 98–111)
Creatinine, Ser: 0.89 mg/dL (ref 0.44–1.00)
GFR, Estimated: >60 mL/min (ref >60)
Glucose, Bld: 97 mg/dL (ref 70–99)
Potassium: 3.4 mmol/L — ABNORMAL LOW (ref 3.5–5.1)
Sodium: 133 mmol/L — ABNORMAL LOW (ref 135–145)
Total Bilirubin: 0.7 mg/dL (ref 0.3–1.2)
Total Protein: 7.5 g/dL (ref 6.5–8.1)

## 2020-11-21 LAB — TYPE AND SCREEN
ABO/RH(D): O POS
Antibody Screen: NEGATIVE

## 2020-11-21 LAB — CBC
HCT: 36.7 % (ref 36.0–46.0)
Hemoglobin: 11.9 g/dL — ABNORMAL LOW (ref 12.0–15.0)
MCH: 29 pg (ref 26.0–34.0)
MCHC: 32.4 g/dL (ref 30.0–36.0)
MCV: 89.3 fL (ref 80.0–100.0)
Platelets: 247 10*3/uL (ref 150–400)
RBC: 4.11 MIL/uL (ref 3.87–5.11)
RDW: 14.5 % (ref 11.5–15.5)
WBC: 11.5 10*3/uL — ABNORMAL HIGH (ref 4.0–10.5)
nRBC: 0 % (ref 0.0–0.2)

## 2020-11-21 LAB — POC URINE PREG, ED: Preg Test, Ur: POSITIVE — AB

## 2020-11-21 LAB — HCG, QUANTITATIVE, PREGNANCY: hCG, Beta Chain, Quant, S: 6369 m[IU]/mL — ABNORMAL HIGH (ref <5)

## 2020-11-21 LAB — LIPASE, BLOOD: Lipase: 32 U/L (ref 11–51)

## 2020-11-21 NOTE — ED Notes (Signed)
Pt transported to US

## 2020-11-21 NOTE — ED Provider Notes (Signed)
[facility] Emergency Department Provider Note   ____________________________________________   Event Date/Time   First MD Initiated Contact with Patient 11/21/20 1855     (approximate)  I have reviewed the triage vital signs and the nursing notes.   HISTORY  Chief Complaint Dizziness, Abdominal Pain, and Vaginal Bleeding    HPI Veronica Hinton is a 30 y.o. female with the below stated past medical history who presents for nausea, vaginal bleeding, and right lower quadrant abdominal pain that began last night.  Patient states that she believes she is approximately [redacted] weeks pregnant however she has not been to an OB/GYN for confirmation.  Patient's last menstrual period was 10/07/2020.  Patient is also concerned as she has had episodes of dark tarry stools and dizziness for the last 4 days.  Patient does endorse being on iron supplementation for prenatal care.  Patient currently denies any vision changes, tinnitus, difficulty speaking, facial droop, sore throat, chest pain, shortness of breath, nausea/vomiting/diarrhea, dysuria, or numbness/paresthesias in any extremity         Past Medical History:  Diagnosis Date   Thyroid disease     There are no problems to display for this patient.   No past surgical history on file.  Prior to Admission medications   Medication Sig Start Date End Date Taking? Authorizing Provider  levothyroxine (SYNTHROID) 125 MCG tablet Take 1 tablet (125 mcg total) by mouth daily at 6 (six) AM. 01/04/20   Reggie Pile, MD  QUEtiapine (SEROQUEL) 100 MG tablet Take 1 tablet (100 mg total) by mouth at bedtime. 01/03/20   Reggie Pile, MD    Allergies Latex  No family history on file.  Social History Social History   Tobacco Use   Smoking status: Every Day    Packs/day: 0.50    Pack years: 0.00    Types: Cigarettes   Smokeless tobacco: Never  Substance Use Topics   Alcohol use: Yes   Drug use: No    Types: Cocaine,  Methamphetamines    Comment: Quit 2015    Review of Systems Constitutional: No fever/chills Eyes: No visual changes. ENT: No sore throat. Cardiovascular: Denies chest pain. Respiratory: Denies shortness of breath. Gastrointestinal: Endorses right lower quadrant abdominal pain.  Endorses nausea, no vomiting.  No diarrhea. Genitourinary: Negative for dysuria. Musculoskeletal: Negative for acute arthralgias Skin: Negative for rash. Neurological: Negative for headaches, weakness/numbness/paresthesias in any extremity Psychiatric: Negative for suicidal ideation/homicidal ideation   ____________________________________________   PHYSICAL EXAM:  VITAL SIGNS: ED Triage Vitals  Enc Vitals Group     BP 11/21/20 1801 139/73     Pulse Rate 11/21/20 1801 89     Resp 11/21/20 1801 16     Temp 11/21/20 1801 98.7 F (37.1 C)     Temp Source 11/21/20 1801 Oral     SpO2 11/21/20 1801 100 %     Weight 11/21/20 1802 184 lb (83.5 kg)     Height 11/21/20 1802 5' (1.524 m)     Head Circumference --      Peak Flow --      Pain Score 11/21/20 1801 8     Pain Loc --      Pain Edu? --      Excl. in GC? --    Constitutional: Alert and oriented. Well appearing and in no acute distress. Eyes: Conjunctivae are normal. PERRL. Head: Atraumatic. Nose: No congestion/rhinnorhea. Mouth/Throat: Mucous membranes are moist. Neck: No stridor Cardiovascular: Grossly normal heart sounds.  Good  peripheral circulation. Respiratory: Normal respiratory effort.  No retractions. Gastrointestinal: Soft and nontender. No distention. Musculoskeletal: No obvious deformities Neurologic:  Normal speech and language. No gross focal neurologic deficits are appreciated. Skin:  Skin is warm and dry. No rash noted. Psychiatric: Mood and affect are normal. Speech and behavior are normal.  ____________________________________________   LABS (all labs ordered are listed, but only abnormal results are displayed)  Labs  Reviewed  COMPREHENSIVE METABOLIC PANEL - Abnormal; Notable for the following components:      Result Value   Sodium 133 (*)    Potassium 3.4 (*)    Calcium 8.5 (*)    AST 14 (*)    All other components within normal limits  CBC - Abnormal; Notable for the following components:   WBC 11.5 (*)    Hemoglobin 11.9 (*)    All other components within normal limits  URINALYSIS, COMPLETE (UACMP) WITH MICROSCOPIC - Abnormal; Notable for the following components:   Color, Urine YELLOW (*)    APPearance CLOUDY (*)    Hgb urine dipstick SMALL (*)    Bacteria, UA RARE (*)    All other components within normal limits  POC URINE PREG, ED - Abnormal; Notable for the following components:   Preg Test, Ur POSITIVE (*)    All other components within normal limits  LIPASE, BLOOD  HCG, QUANTITATIVE, PREGNANCY  TYPE AND SCREEN   ____________________________________________  EKG  ED ECG REPORT I, Merwyn Katos, the attending physician, personally viewed and interpreted this ECG.  Date: 11/21/2020 EKG Time: 1805 Rate: 86 Rhythm: normal sinus rhythm QRS Axis: normal Intervals: normal ST/T Wave abnormalities: normal Narrative Interpretation: no evidence of acute ischemia  ____________________________________________  RADIOLOGY  ED MD interpretation: Pelvic ultrasound pending  Official radiology report(s): No results found.  ____________________________________________   PROCEDURES  Procedure(s) performed (including Critical Care):  Procedures   ____________________________________________   INITIAL IMPRESSION / ASSESSMENT AND PLAN / ED COURSE  As part of my medical decision making, I reviewed the following data within the electronic MEDICAL RECORD NUMBER Nursing notes reviewed and incorporated, Labs reviewed, EKG interpreted, Old chart reviewed, Radiograph reviewed and Notes from prior ED visits reviewed and incorporated      Workup: CBC, BMP, UA, bHCG, Type&Screen, 1st  Trimester Ultrasound  Based on History, Exam, and ED Workup patient's presentation not consistent with ectopic pregnancy, molar pregnancy, life-threatening coagulopathy, trauma, serious bacterial infection, central process or other emergency.  Disposition: Care of this patient will be signed out to the oncoming physician at the end of my shift.  All pertinent patient information conveyed and all questions answered.  All further care and disposition decisions will be made by the oncoming physician.      ____________________________________________   FINAL CLINICAL IMPRESSION(S) / ED DIAGNOSES  Final diagnoses:  None     ED Discharge Orders     None        Note:  This document was prepared using Dragon voice recognition software and may include unintentional dictation errors.    Merwyn Katos, MD 11/22/20 [phone]

## 2020-11-21 NOTE — ED Provider Notes (Signed)
Ultrasound demonstrates gestational sac, discussed with patient the need for repeat ultrasound in 10 to 14 days.   Jene Every, MD 11/21/20 2106

## 2020-11-21 NOTE — ED Triage Notes (Addendum)
Pt via POV from home with multiple complaints. Pt c/o nausea, black tarry stools, and dizziness for 4 days, 1 episode of vaginal bleeding last night, and RLQ pain since last night. Pt states she approx 6 weeks pregnanct, has not been confirmed by a doctor. Gravida 4. LMP 5/6. Pt is A&Ox4 and NAD

## 2020-11-21 NOTE — Discharge Instructions (Addendum)
Repeat US in 10-14 days

## 2020-12-07 DIAGNOSIS — O0993 Supervision of high risk pregnancy, unspecified, third trimester: Secondary | ICD-10-CM | POA: Insufficient documentation

## 2020-12-20 ENCOUNTER — Other Ambulatory Visit: Payer: Self-pay | Admitting: Obstetrics and Gynecology

## 2020-12-20 ENCOUNTER — Other Ambulatory Visit: Payer: Self-pay

## 2020-12-20 ENCOUNTER — Ambulatory Visit
Admission: RE | Admit: 2020-12-20 | Discharge: 2020-12-20 | Disposition: A | Discharge status: Home / Self Care | Payer: Medicaid Other | Source: Ambulatory Visit | Attending: Obstetrics and Gynecology | Admitting: Obstetrics and Gynecology

## 2020-12-20 ENCOUNTER — Other Ambulatory Visit (HOSPITAL_COMMUNITY): Payer: Self-pay | Admitting: Obstetrics and Gynecology

## 2020-12-20 DIAGNOSIS — O200 Threatened abortion: Secondary | ICD-10-CM | POA: Insufficient documentation

## 2021-01-17 ENCOUNTER — Telehealth: Payer: Self-pay | Admitting: Licensed Clinical Social Worker
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
## 2021-01-24 LAB — OB RESULTS CONSOLE RPR: RPR: NONREACTIVE

## 2021-01-24 LAB — OB RESULTS CONSOLE HEPATITIS B SURFACE ANTIGEN: Hepatitis B Surface Ag: NEGATIVE

## 2021-01-24 LAB — OB RESULTS CONSOLE VARICELLA ZOSTER ANTIBODY, IGG: Varicella: IMMUNE

## 2021-01-24 LAB — OB RESULTS CONSOLE RUBELLA ANTIBODY, IGM: Rubella: IMMUNE

## 2021-01-24 LAB — OB RESULTS CONSOLE HIV ANTIBODY (ROUTINE TESTING): HIV: NONREACTIVE

## 2021-02-01 ENCOUNTER — Ambulatory Visit: Payer: Medicaid Other | Admitting: Licensed Clinical Social Worker
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
## 2021-04-06 ENCOUNTER — Telehealth: Payer: Self-pay | Admitting: Licensed Clinical Social Worker

## 2021-04-06 NOTE — Telephone Encounter (Signed)
-----   Message from Veronica Links, RN sent at 04/05/2021  9:57 AM EDT ----- [name] I just spoke with this patient I think I did a referral recently for her. Would you reach out to her she is in need of counseling. Her phone is wifi only so if she doesn't have a wifi siginal she is unable to receive the call.

## 2021-04-25 ENCOUNTER — Other Ambulatory Visit: Payer: Self-pay

## 2021-04-25 ENCOUNTER — Emergency Department
Admission: EM | Admit: 2021-04-25 | Discharge: 2021-04-25 | Disposition: A | Discharge status: Home / Self Care | Payer: Medicaid Other | Attending: Emergency Medicine | Admitting: Emergency Medicine

## 2021-04-25 ENCOUNTER — Encounter: Payer: Self-pay | Admitting: Emergency Medicine

## 2021-04-25 DIAGNOSIS — J029 Acute pharyngitis, unspecified: Secondary | ICD-10-CM | POA: Insufficient documentation

## 2021-04-25 DIAGNOSIS — F17210 Nicotine dependence, cigarettes, uncomplicated: Secondary | ICD-10-CM | POA: Diagnosis not present

## 2021-04-25 DIAGNOSIS — O09892 Supervision of other high risk pregnancies, second trimester: Secondary | ICD-10-CM | POA: Diagnosis not present

## 2021-04-25 DIAGNOSIS — Z20822 Contact with and (suspected) exposure to covid-19: Secondary | ICD-10-CM | POA: Insufficient documentation

## 2021-04-25 DIAGNOSIS — J069 Acute upper respiratory infection, unspecified: Secondary | ICD-10-CM

## 2021-04-25 DIAGNOSIS — Z91040 Latex allergy status: Secondary | ICD-10-CM | POA: Insufficient documentation

## 2021-04-25 DIAGNOSIS — Z3A27 27 weeks gestation of pregnancy: Secondary | ICD-10-CM | POA: Insufficient documentation

## 2021-04-25 DIAGNOSIS — O99512 Diseases of the respiratory system complicating pregnancy, second trimester: Secondary | ICD-10-CM | POA: Insufficient documentation

## 2021-04-25 LAB — RESP PANEL BY RT-PCR (FLU A&B, COVID) ARPGX2
Influenza A by PCR: NEGATIVE
Influenza B by PCR: NEGATIVE
SARS Coronavirus 2 by RT PCR: NEGATIVE

## 2021-04-25 LAB — GROUP A STREP BY PCR: Group A Strep by PCR: NOT DETECTED

## 2021-04-25 NOTE — ED Provider Notes (Signed)
[facility] Emergency Department Provider Note  ____________________________________________   Event Date/Time   First MD Initiated Contact with Patient 04/25/21 [phone]     (approximate)  I have reviewed the triage vital signs and the nursing notes.   HISTORY  Chief Complaint Sore Throat   HPI Veronica Hinton is a 30 y.o. female presents to the ED with complaint of sore throat without known fever.  Patient is currently [redacted] weeks pregnant.  She denies any complications with her pregnancy.      Past Medical History:  Diagnosis Date   Thyroid disease     There are no problems to display for this patient.   History reviewed. No pertinent surgical history.  Prior to Admission medications   Medication Sig Start Date End Date Taking? Authorizing Provider  levothyroxine (SYNTHROID) 125 MCG tablet Take 1 tablet (125 mcg total) by mouth daily at 6 (six) AM. 01/04/20   Reggie Pile, MD  QUEtiapine (SEROQUEL) 100 MG tablet Take 1 tablet (100 mg total) by mouth at bedtime. 01/03/20   Reggie Pile, MD    Allergies Latex  History reviewed. No pertinent family history.  Social History Social History   Tobacco Use   Smoking status: Every Day    Packs/day: 0.50    Types: Cigarettes   Smokeless tobacco: Never  Substance Use Topics   Alcohol use: Yes   Drug use: No    Types: Cocaine, Methamphetamines    Comment: Quit 2015    Review of Systems Constitutional: No fever/chills Eyes: No visual changes. ENT: Positive sore throat. Cardiovascular: Denies chest pain. Respiratory: Denies shortness of breath.  Positive occasional cough. Gastrointestinal: No abdominal pain.  No nausea, no vomiting.  Genitourinary: Negative for dysuria. Musculoskeletal: Negative for muscle aches. Skin: Negative for rash. Neurological: Negative for headaches, focal weakness or numbness. ____________________________________________   PHYSICAL EXAM:  VITAL SIGNS: ED Triage  Vitals  Enc Vitals Group     BP 04/25/21 0950 115/73     Pulse Rate 04/25/21 0950 96     Resp 04/25/21 0950 18     Temp 04/25/21 0950 98.1 F (36.7 C)     Temp Source 04/25/21 0950 Oral     SpO2 04/25/21 0950 99 %     Weight 04/25/21 0944 191 lb (86.6 kg)     Height 04/25/21 0944 5' (1.524 m)     Head Circumference --      Peak Flow --      Pain Score 04/25/21 0944 6     Pain Loc --      Pain Edu? --      Excl. in GC? --     Constitutional: Alert and oriented. Well appearing and in no acute distress. Eyes: Conjunctivae are normal.  Head: Atraumatic. Nose: No congestion/rhinnorhea. Mouth/Throat: Mucous membranes are moist.  Oropharynx non-erythematous.  No exudate is noted. Neck: No stridor.   Cardiovascular: Normal rate, regular rhythm. Grossly normal heart sounds.  Good peripheral circulation. Respiratory: Normal respiratory effort.  No retractions. Lungs CTAB. Gastrointestinal: Soft and nontender. No distention.  Musculoskeletal: Moves upper and lower extremities they have difficulty and normal gait was noted. Neurologic:  Normal speech and language. No gross focal neurologic deficits are appreciated. No gait instability. Skin:  Skin is warm, dry and intact. No rash noted. Psychiatric: Mood and affect are normal. Speech and behavior are normal.  ____________________________________________   LABS (all labs ordered are listed, but only abnormal results are displayed)  Labs Reviewed  GROUP  A STREP BY PCR  RESP PANEL BY RT-PCR (FLU A&B, COVID) [id number]     PROCEDURES  Procedure(s) performed (including Critical Care):  Procedures   ____________________________________________   INITIAL IMPRESSION / ASSESSMENT AND PLAN / ED COURSE  As part of my medical decision making, I reviewed the following data within the electronic MEDICAL RECORD NUMBER Notes from prior ED visits and [place] Controlled Substance Database  30 year old female presents to the ED with complaint of sore  throat and concerns that she is [redacted] weeks pregnant.  She denies any complications with her pregnancy.  Respiratory panel was negative for COVID and influenza.  Patient was made aware that her strep test is negative.  She is aware that there are many things that she cannot take for her symptoms and will refer to the paper that was given to her by her OB/GYN.  Increase fluids.  Tylenol sparingly.   ____________________________________________   FINAL CLINICAL IMPRESSION(S) / ED DIAGNOSES  Final diagnoses:  Viral URI with cough     ED Discharge Orders     None        Note:  This document was prepared using Dragon voice recognition software and may include unintentional dictation errors.    Tommi Rumps, PA-C 04/25/21 1512    Delton Prairie, MD 04/25/21 [phone]

## 2021-04-25 NOTE — ED Notes (Signed)
Pt is currently [redacted] weeks pregnant.

## 2021-04-25 NOTE — ED Triage Notes (Signed)
Sore throat today.

## 2021-04-25 NOTE — Discharge Instructions (Signed)
Follow-up with your OB/GYN as needed if any continued problems.  Saline nose spray for nasal congestion.  Increase fluids.  You may sparingly take Tylenol if needed.  You are limited to the medications that your OB/GYN suggested due to your pregnancy.  If any severe worsening of your symptoms return to the emergency department over the holiday weekend.

## 2021-05-10 ENCOUNTER — Ambulatory Visit: Payer: No Typology Code available for payment source | Admitting: Licensed Clinical Social Worker

## 2021-05-10 ENCOUNTER — Encounter: Payer: Self-pay | Admitting: Licensed Clinical Social Worker

## 2021-05-10 DIAGNOSIS — F4310 Post-traumatic stress disorder, unspecified: Secondary | ICD-10-CM | POA: Insufficient documentation
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
## 2021-05-18 ENCOUNTER — Ambulatory Visit: Payer: No Typology Code available for payment source | Admitting: Licensed Clinical Social Worker

## 2021-06-04 NOTE — L&D Delivery Note (Signed)
Delivery Note  Veronica Hinton is a [id number] at [redacted]w[redacted]d with an LMP of 10/07/20, inconsistent with [place] at [redacted]w[redacted]d.   First Stage: Labor onset: 0540 Augmentation: oxytocin Analgesia /Anesthesia intrapartum: IVPM SROM at 0540 GBS: negative IP Antibiotics: none  Second Stage: Complete dilation at 1609 Onset of pushing at 1613 FHR second stage Category 2, FHR 110 BPM, moderate variability, variable x1  Delivery of a viable baby boy on 07/14/2021 at 1624 by Chari Manning CNM. Veronica Hinton progressed to complete and spontaneously pushing for 11 minutes before delivery of fetal head in OA position with restitution to LOT. 1 nuchal cord loose, delivered through;  Anterior then posterior shoulders delivered easily with gentle downward traction. Baby placed on mom's chest, and attended to by baby RN Cord double clamped after cessation of pulsation, cut by FOB  Cord blood sample collection: Yes O POS Collection of cord blood donation N/A Arterial cord blood sample  N/A  Third Stage: Oxytocin bolus started after delivery of infant for hemorrhage prophylaxis  Placenta delivered Veronica Hinton intact with 3 VC @ 1630 Placenta disposition: discarded per protocol Uterine tone firm / bleeding small  Right labial abrasion identified  Anesthesia for repair: N/A Repair N/A Est. Blood Loss (mL): 100  Complications: none- Precipitous labor 6-10 cm in 19 mins  Mom to postpartum.  Baby to Couplet care / Skin to Skin.  Newborn: Information for the patient's newborn:  Veronica, Hinton [[id number]]  Live born female  Birth Weight:   APGAR: 6, 8  Newborn Delivery   Birth date/time: 07/14/2021 16:24:00 Delivery type: Vaginal, Spontaneous        Feeding planned: Breast  ---------- Chari Manning CNM Certified Nurse [facility]

## 2021-06-29 LAB — OB RESULTS CONSOLE GBS: GBS: NEGATIVE

## 2021-06-29 LAB — OB RESULTS CONSOLE GC/CHLAMYDIA
Chlamydia: NEGATIVE
Gonorrhea: NEGATIVE

## 2021-07-10 ENCOUNTER — Observation Stay
Admission: EM | Admit: 2021-07-10 | Discharge: 2021-07-10 | Disposition: A | Discharge status: Home / Self Care | Payer: Medicaid Other | Attending: Obstetrics and Gynecology | Admitting: Obstetrics and Gynecology

## 2021-07-10 ENCOUNTER — Other Ambulatory Visit: Payer: Self-pay

## 2021-07-10 ENCOUNTER — Encounter: Payer: Self-pay | Admitting: Obstetrics and Gynecology

## 2021-07-10 DIAGNOSIS — O26899 Other specified pregnancy related conditions, unspecified trimester: Secondary | ICD-10-CM | POA: Diagnosis present

## 2021-07-10 DIAGNOSIS — Z91040 Latex allergy status: Secondary | ICD-10-CM | POA: Diagnosis not present

## 2021-07-10 DIAGNOSIS — Z79899 Other long term (current) drug therapy: Secondary | ICD-10-CM | POA: Insufficient documentation

## 2021-07-10 DIAGNOSIS — E039 Hypothyroidism, unspecified: Secondary | ICD-10-CM | POA: Insufficient documentation

## 2021-07-10 DIAGNOSIS — O471 False labor at or after 37 completed weeks of gestation: Secondary | ICD-10-CM | POA: Diagnosis not present

## 2021-07-10 DIAGNOSIS — O99283 Endocrine, nutritional and metabolic diseases complicating pregnancy, third trimester: Secondary | ICD-10-CM | POA: Insufficient documentation

## 2021-07-10 DIAGNOSIS — O99333 Smoking (tobacco) complicating pregnancy, third trimester: Secondary | ICD-10-CM | POA: Diagnosis not present

## 2021-07-10 DIAGNOSIS — Z3A38 38 weeks gestation of pregnancy: Secondary | ICD-10-CM | POA: Diagnosis not present

## 2021-07-10 DIAGNOSIS — F17210 Nicotine dependence, cigarettes, uncomplicated: Secondary | ICD-10-CM | POA: Insufficient documentation

## 2021-07-10 DIAGNOSIS — O0993 Supervision of high risk pregnancy, unspecified, third trimester: Secondary | ICD-10-CM | POA: Diagnosis not present

## 2021-07-10 DIAGNOSIS — R109 Unspecified abdominal pain: Secondary | ICD-10-CM | POA: Diagnosis present

## 2021-07-10 HISTORY — DX: Other specified health status: Z78.9

## 2021-07-10 NOTE — Progress Notes (Signed)
Pt PO hydrated- reports intermittent "crampy" feeling- no increase in pain. Pt reports desire to go home with labor precautions.  Verbal order from CNM to discharge patient,  Pt discharged home per order.  Pt stable and ambulatory and an After Visit Summary was printed and given to the patient. Discharge education completed with patient/family including follow up instructions, appointments, and medication list. Pt received labor and bleeding precautions. Patient able to verbalize understanding, all questions fully answered upon discharge. Patient instructed to return to ED, call 911, or call MD for any changes in condition. Pt discharged home via personal vehicle with support person.

## 2021-07-10 NOTE — OB Triage Note (Signed)
Pt presented to L/D triage with reported loss of mucus plug and contractions that began last night-unrelieved by rest. Pt reports no bleeding or LOF. Pt reports increase of white "sudsy discharge". Pt reports positive fetal movement. Pt reports rectal pressure, general weakness, and 1 episode of vomiting this morning- ongoing nausea the past few days.  Pt reports no water intake today and feeling " a little dehydrated". No urinary discomfort.  Pt reports intercourse this morning.  Monitors applied and assessing- initial FHT 118.  VSS.
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
## 2021-07-14 ENCOUNTER — Encounter: Payer: Self-pay | Admitting: Obstetrics and Gynecology

## 2021-07-14 ENCOUNTER — Inpatient Hospital Stay
Admission: EM | Admit: 2021-07-14 | Discharge: 2021-07-15 | DRG: 798 | Disposition: A | Discharge status: Home / Self Care | Payer: Medicaid Other | Attending: Obstetrics | Admitting: Obstetrics

## 2021-07-14 ENCOUNTER — Other Ambulatory Visit: Payer: Self-pay

## 2021-07-14 ENCOUNTER — Encounter: Payer: Self-pay | Admitting: Anesthesiology

## 2021-07-14 DIAGNOSIS — Z302 Encounter for sterilization: Secondary | ICD-10-CM

## 2021-07-14 DIAGNOSIS — F17210 Nicotine dependence, cigarettes, uncomplicated: Secondary | ICD-10-CM | POA: Diagnosis present

## 2021-07-14 DIAGNOSIS — Z20822 Contact with and (suspected) exposure to covid-19: Secondary | ICD-10-CM | POA: Diagnosis present

## 2021-07-14 DIAGNOSIS — O99214 Obesity complicating childbirth: Secondary | ICD-10-CM | POA: Diagnosis present

## 2021-07-14 DIAGNOSIS — O99284 Endocrine, nutritional and metabolic diseases complicating childbirth: Secondary | ICD-10-CM | POA: Diagnosis present

## 2021-07-14 DIAGNOSIS — Z3A39 39 weeks gestation of pregnancy: Secondary | ICD-10-CM

## 2021-07-14 DIAGNOSIS — O99334 Smoking (tobacco) complicating childbirth: Secondary | ICD-10-CM | POA: Diagnosis present

## 2021-07-14 DIAGNOSIS — E039 Hypothyroidism, unspecified: Secondary | ICD-10-CM | POA: Diagnosis present

## 2021-07-14 DIAGNOSIS — O26893 Other specified pregnancy related conditions, third trimester: Secondary | ICD-10-CM | POA: Diagnosis present

## 2021-07-14 HISTORY — DX: Mental disorder, not otherwise specified: F99

## 2021-07-14 LAB — CBC
HCT: 35.4 % — ABNORMAL LOW (ref 36.0–46.0)
Hemoglobin: 11.5 g/dL — ABNORMAL LOW (ref 12.0–15.0)
MCH: 27.1 pg (ref 26.0–34.0)
MCHC: 32.5 g/dL (ref 30.0–36.0)
MCV: 83.5 fL (ref 80.0–100.0)
Platelets: 239 10*3/uL (ref 150–400)
RBC: 4.24 MIL/uL (ref 3.87–5.11)
RDW: 14.4 % (ref 11.5–15.5)
WBC: 13.4 10*3/uL — ABNORMAL HIGH (ref 4.0–10.5)
nRBC: 0 % (ref 0.0–0.2)

## 2021-07-14 LAB — TYPE AND SCREEN
ABO/RH(D): O POS
Antibody Screen: NEGATIVE

## 2021-07-14 LAB — URINE DRUG SCREEN, QUALITATIVE (ARMC ONLY)
Amphetamines, Ur Screen: NOT DETECTED
Barbiturates, Ur Screen: NOT DETECTED
Benzodiazepine, Ur Scrn: NOT DETECTED
Cannabinoid 50 Ng, Ur ~~LOC~~: POSITIVE — AB
Cocaine Metabolite,Ur ~~LOC~~: NOT DETECTED
MDMA (Ecstasy)Ur Screen: NOT DETECTED
Methadone Scn, Ur: NOT DETECTED
Opiate, Ur Screen: NOT DETECTED
Phencyclidine (PCP) Ur S: NOT DETECTED
Tricyclic, Ur Screen: NOT DETECTED

## 2021-07-14 LAB — RESP PANEL BY RT-PCR (FLU A&B, COVID) ARPGX2
Influenza A by PCR: NEGATIVE
Influenza B by PCR: NEGATIVE
SARS Coronavirus 2 by RT PCR: NEGATIVE

## 2021-07-14 LAB — RUPTURE OF MEMBRANE (ROM)PLUS: Rom Plus: POSITIVE

## 2021-07-14 MED ORDER — SENNOSIDES-DOCUSATE SODIUM 8.6-50 MG PO TABS
2.0000 | ORAL_TABLET | ORAL | Status: DC
  Administered 2021-07-15 (×2): 2 via ORAL
  Filled 2021-07-14 (×2): qty 2

## 2021-07-14 MED ORDER — AMMONIA AROMATIC IN INHA
RESPIRATORY_TRACT | Status: AC
  Filled 2021-07-14: qty 10

## 2021-07-14 MED ORDER — OXYTOCIN-SODIUM CHLORIDE 30-0.9 UT/500ML-% IV SOLN
2.5000 [IU]/h | INTRAVENOUS | Status: DC
  Administered 2021-07-14: 2.5 [IU]/h via INTRAVENOUS

## 2021-07-14 MED ORDER — OXYTOCIN 10 UNIT/ML IJ SOLN
INTRAMUSCULAR | Status: AC
  Filled 2021-07-14: qty 2

## 2021-07-14 MED ORDER — SODIUM CHLORIDE 0.9% FLUSH
3.0000 mL | Freq: Two times a day (BID) | INTRAVENOUS | Status: DC
  Administered 2021-07-15 (×2): 3 mL via INTRAVENOUS

## 2021-07-14 MED ORDER — DIPHENHYDRAMINE HCL 25 MG PO CAPS: 25.0000 mg | ORAL_CAPSULE | Freq: Four times a day (QID) | ORAL | Status: DC | PRN

## 2021-07-14 MED ORDER — SODIUM CHLORIDE 0.9% FLUSH: 3.0000 mL | INTRAVENOUS | Status: DC | PRN

## 2021-07-14 MED ORDER — LACTATED RINGERS IV SOLN: INTRAVENOUS | Status: DC

## 2021-07-14 MED ORDER — SIMETHICONE 80 MG PO CHEW: 80.0000 mg | CHEWABLE_TABLET | ORAL | Status: DC | PRN

## 2021-07-14 MED ORDER — BUTORPHANOL TARTRATE 1 MG/ML IJ SOLN
1.0000 mg | INTRAMUSCULAR | Status: DC | PRN
  Administered 2021-07-14: 1 mg via INTRAVENOUS
  Filled 2021-07-14: qty 1

## 2021-07-14 MED ORDER — IBUPROFEN 600 MG PO TABS
600.0000 mg | ORAL_TABLET | Freq: Four times a day (QID) | ORAL | Status: DC
  Administered 2021-07-14 – 2021-07-15 (×4): 600 mg via ORAL
  Filled 2021-07-14 (×5): qty 1

## 2021-07-14 MED ORDER — FAMOTIDINE 20 MG PO TABS
40.0000 mg | ORAL_TABLET | Freq: Once | ORAL | Status: AC
  Administered 2021-07-15: 40 mg via ORAL
  Filled 2021-07-14: qty 2

## 2021-07-14 MED ORDER — OXYTOCIN BOLUS FROM INFUSION
333.0000 mL | Freq: Once | INTRAVENOUS | Status: DC
  Administered 2021-07-14: 333 mL via INTRAVENOUS

## 2021-07-14 MED ORDER — LIDOCAINE HCL (PF) 1 % IJ SOLN
INTRAMUSCULAR | Status: AC
  Filled 2021-07-14: qty 30

## 2021-07-14 MED ORDER — FENTANYL CITRATE (PF) 100 MCG/2ML IJ SOLN
INTRAMUSCULAR | Status: AC
  Administered 2021-07-14: 50 ug via INTRAVENOUS
  Filled 2021-07-14: qty 2

## 2021-07-14 MED ORDER — ZOLPIDEM TARTRATE 5 MG PO TABS: 5.0000 mg | ORAL_TABLET | Freq: Every evening | ORAL | Status: DC | PRN

## 2021-07-14 MED ORDER — LIDOCAINE HCL (PF) 1 % IJ SOLN: 30.0000 mL | INTRAMUSCULAR | Status: DC | PRN

## 2021-07-14 MED ORDER — ACETAMINOPHEN 325 MG PO TABS: 650.0000 mg | ORAL_TABLET | ORAL | Status: DC | PRN

## 2021-07-14 MED ORDER — OXYTOCIN-SODIUM CHLORIDE 30-0.9 UT/500ML-% IV SOLN
INTRAVENOUS | Status: AC
  Administered 2021-07-14: 2 m[IU]/min via INTRAVENOUS
  Filled 2021-07-14: qty 500

## 2021-07-14 MED ORDER — ACETAMINOPHEN 325 MG PO TABS
650.0000 mg | ORAL_TABLET | ORAL | Status: DC | PRN
  Administered 2021-07-14 – 2021-07-15 (×2): 650 mg via ORAL
  Filled 2021-07-14 (×2): qty 2

## 2021-07-14 MED ORDER — WITCH HAZEL-GLYCERIN EX PADS: 1.0000 "application " | MEDICATED_PAD | CUTANEOUS | Status: DC | PRN

## 2021-07-14 MED ORDER — ONDANSETRON HCL 4 MG PO TABS: 4.0000 mg | ORAL_TABLET | ORAL | Status: DC | PRN

## 2021-07-14 MED ORDER — TETANUS-DIPHTH-ACELL PERTUSSIS 5-2.5-18.5 LF-MCG/0.5 IM SUSY
0.5000 mL | PREFILLED_SYRINGE | Freq: Once | INTRAMUSCULAR | Status: DC
  Filled 2021-07-14: qty 0.5

## 2021-07-14 MED ORDER — BENZOCAINE-MENTHOL 20-0.5 % EX AERO
1.0000 "application " | INHALATION_SPRAY | CUTANEOUS | Status: DC | PRN
  Administered 2021-07-14: 1 via TOPICAL
  Filled 2021-07-14: qty 56

## 2021-07-14 MED ORDER — ONDANSETRON HCL 4 MG/2ML IJ SOLN: 4.0000 mg | Freq: Four times a day (QID) | INTRAMUSCULAR | Status: DC | PRN

## 2021-07-14 MED ORDER — OXYCODONE HCL 5 MG PO TABS
5.0000 mg | ORAL_TABLET | ORAL | Status: DC | PRN
  Administered 2021-07-14: 5 mg via ORAL
  Filled 2021-07-14: qty 1

## 2021-07-14 MED ORDER — ONDANSETRON HCL 4 MG/2ML IJ SOLN: 4.0000 mg | INTRAMUSCULAR | Status: DC | PRN

## 2021-07-14 MED ORDER — PRENATAL MULTIVITAMIN CH: 1.0000 | ORAL_TABLET | Freq: Every day | ORAL | Status: DC

## 2021-07-14 MED ORDER — MISOPROSTOL 200 MCG PO TABS
ORAL_TABLET | ORAL | Status: AC
  Filled 2021-07-14: qty 4

## 2021-07-14 MED ORDER — COCONUT OIL OIL
1.0000 "application " | TOPICAL_OIL | Status: DC | PRN
  Filled 2021-07-14: qty 120

## 2021-07-14 MED ORDER — DIBUCAINE (PERIANAL) 1 % EX OINT: 1.0000 "application " | TOPICAL_OINTMENT | CUTANEOUS | Status: DC | PRN

## 2021-07-14 MED ORDER — SODIUM CHLORIDE 0.9 % IV SOLN: 250.0000 mL | INTRAVENOUS | Status: DC | PRN

## 2021-07-14 MED ORDER — SOD CITRATE-CITRIC ACID 500-334 MG/5ML PO SOLN
ORAL | Status: AC
  Filled 2021-07-14: qty 15

## 2021-07-14 MED ORDER — SOD CITRATE-CITRIC ACID 500-334 MG/5ML PO SOLN
ORAL | Status: AC
  Administered 2021-07-14: 30 mL via ORAL
  Filled 2021-07-14: qty 15

## 2021-07-14 MED ORDER — SOD CITRATE-CITRIC ACID 500-334 MG/5ML PO SOLN
30.0000 mL | ORAL | Status: DC | PRN
  Administered 2021-07-14: 30 mL via ORAL

## 2021-07-14 MED ORDER — MEASLES, MUMPS & RUBELLA VAC IJ SOLR
0.5000 mL | Freq: Once | INTRAMUSCULAR | Status: DC
  Filled 2021-07-14: qty 0.5

## 2021-07-14 MED ORDER — TERBUTALINE SULFATE 1 MG/ML IJ SOLN: 0.2500 mg | Freq: Once | INTRAMUSCULAR | Status: DC | PRN

## 2021-07-14 MED ORDER — FLEET ENEMA 7-19 GM/118ML RE ENEM: 1.0000 | ENEMA | Freq: Every day | RECTAL | Status: DC | PRN

## 2021-07-14 MED ORDER — METOCLOPRAMIDE HCL 10 MG PO TABS
10.0000 mg | ORAL_TABLET | Freq: Once | ORAL | Status: AC
  Administered 2021-07-15: 10 mg via ORAL
  Filled 2021-07-14: qty 1

## 2021-07-14 MED ORDER — BISACODYL 10 MG RE SUPP
10.0000 mg | Freq: Every day | RECTAL | Status: DC | PRN
  Filled 2021-07-14: qty 1

## 2021-07-14 MED ORDER — OXYTOCIN-SODIUM CHLORIDE 30-0.9 UT/500ML-% IV SOLN: 1.0000 m[IU]/min | INTRAVENOUS | Status: DC

## 2021-07-14 MED ORDER — LACTATED RINGERS IV SOLN
INTRAVENOUS | Status: DC
  Administered 2021-07-15: 10 mL/h via INTRAVENOUS

## 2021-07-14 MED ORDER — LACTATED RINGERS IV SOLN: 500.0000 mL | INTRAVENOUS | Status: DC | PRN

## 2021-07-14 MED ORDER — FENTANYL CITRATE (PF) 100 MCG/2ML IJ SOLN: 50.0000 ug | Freq: Once | INTRAMUSCULAR | Status: DC

## 2021-07-14 NOTE — Progress Notes (Signed)
Patient ID: Veronica Hinton, female   DOB: 1990/11/08, 31 y.o.   MRN: [id number] Pt is interested in PPBTL 07/15/21. I spoke to pt and examined her Abd. No contraindications . Risks explained as well as failure . All questions answered . Proceed

## 2021-07-14 NOTE — Discharge Summary (Signed)
Obstetrical Discharge Summary  Patient Name: Veronica Hinton DOB: Sep 16, 1990 MRN: [id number]  Date of Admission: 07/14/2021 Date of Delivery: 07/14/21  Delivered by: Avelino Leeds CNM  Date of Discharge: 07/15/2021  Primary OB: [facility] [id number] last menstrual period was 10/07/2020 (approximate). EDC Estimated Date of Delivery: 07/21/21 Gestational Age at Delivery: [redacted]w[redacted]d   Antepartum complications:  1. Hypothyroidism, poor compliance, restarted medication Synthroid 114mcg on 12/07/20 2. Obesity, BMI 35.7 3. 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  4. Tobacco use:  5. Elevated LFTs 01/24/21 6. Mental health/social issues (copy from H&P)  Admitting Diagnosis: SROM Secondary Diagnosis: Patient Active Problem List   Diagnosis Date Noted   Normal labor and delivery 0000000   Cramping complicating pregnancy, antepartum 07/10/2021   PTSD (post-traumatic stress disorder) 05/10/2021   Supervision of high risk pregnancy in third trimester 12/07/2020    Augmentation: Pitocin Complications: None Intrapartum complications/course: none Delivery Type: spontaneous vaginal delivery Anesthesia: IVPM Placenta: spontaneous Laceration: right labial abrasion Episiotomy: none Newborn Data: Live born female  Birth Weight: 7 lb 4.4 oz (3300 g) APGAR: 6, 8  Newborn Delivery   Birth date/time: 07/14/2021 16:24:00 Delivery type: Vaginal, Spontaneous        Postpartum Procedures: BTL done on PPD1 by Dr Ouida Sills  Flavia Shipper:  Flavia Shipper Postnatal Depression Scale Screening Tool 07/14/2021  I have been able to laugh and see the funny side of things. 0  I have looked forward with enjoyment to things. 1  I have blamed myself unnecessarily when things went wrong. 1  I have been anxious or worried for no good reason. 2  I have felt scared or panicky for no good reason. 1  Things have been getting on top of me. 1  I have been so unhappy that  I have had difficulty sleeping. 1  I have felt sad or miserable. 1  I have been so unhappy that I have been crying. 0  The thought of harming myself has occurred to me. 0  Edinburgh Postnatal Depression Scale Total 8     Post partum course:  Patient had an uncomplicated postpartum course.  By time of discharge on PPD#1, her pain was controlled on oral pain medications; she had appropriate lochia and was ambulating, voiding without difficulty and tolerating regular diet.  She was deemed stable for discharge to home.    Discharge Physical Exam:  BP 105/74 (BP Location: Left Arm)    Pulse (!) 168    Temp 97.6 F (36.4 C) (Oral)    Resp 16    Ht 5\' 1"  (1.549 m)    Wt 89.4 kg    LMP 10/07/2020 (Approximate)    SpO2 100%    Breastfeeding Unknown    BMI 37.22 kg/m   General: NAD CV: RRR Pulm: CTABL, nl effort ABD: s/nd/nt, fundus firm and below the umbilicus Lochia: moderate Incision: periumbilical, Dsg CDI Perineum:minimal edema/intact DVT Evaluation: LE non-ttp, no evidence of DVT on exam.  Hemoglobin  Date Value Ref Range Status  07/15/2021 10.2 (L) 12.0 - 15.0 g/dL Final   HCT  Date Value Ref Range Status  07/15/2021 31.4 (L) 36.0 - 46.0 % Final     Disposition: stable, discharge to home. Baby Feeding: breastmilk Baby Disposition: home with mom  Rh Immune globulin given: N/A Rubella vaccine given: N/A Varivax vaccine given: N/A Flu vaccine given in AP or PP setting: declined Tdap vaccine given in AP or PP setting: declined  Contraception: BTL  Prenatal  Labs:  Blood type/Rh O pos  Antibody screen neg  Rubella Immune  Varicella Immune  RPR NR  HBsAg Neg  HIV NR  GC neg  Chlamydia neg  Genetic screening negative  1 hour GTT 129  3 hour GTT    GBS neg     Plan:  Ahleena Sonn was discharged to home in good condition. Follow-up appointment with delivering provider in 6 weeks.  Discharge Medications: Allergies as of 07/15/2021       Reactions   Latex Rash         Medication List     STOP taking these medications    promethazine 25 MG tablet Commonly known as: PHENERGAN   QUEtiapine 100 MG tablet Commonly known as: SEROQUEL       TAKE these medications    acetaminophen 325 MG tablet Commonly known as: Tylenol Take 2 tablets (650 mg total) by mouth every 4 (four) hours as needed (for pain scale < 4).   benzocaine-Menthol 20-0.5 % Aero Commonly known as: DERMOPLAST Apply 1 application topically as needed for irritation (perineal discomfort).   coconut oil Oil Apply 1 application topically as needed.   diphenhydrAMINE 25 mg capsule Commonly known as: BENADRYL Take 1 capsule (25 mg total) by mouth every 6 (six) hours as needed for itching.   ibuprofen 600 MG tablet Commonly known as: ADVIL Take 1 tablet (600 mg total) by mouth every 6 (six) hours. Start taking on: July 16, 2021   levothyroxine 125 MCG tablet Commonly known as: SYNTHROID Take 1 tablet (125 mcg total) by mouth daily at 6 (six) AM.   ondansetron 4 MG tablet Commonly known as: ZOFRAN Take 1 tablet (4 mg total) by mouth every 8 (eight) hours as needed for nausea.   oxyCODONE 5 MG immediate release tablet Commonly known as: Oxy IR/ROXICODONE Take 1 tablet (5 mg total) by mouth every 6 (six) hours as needed for up to 3 days (pain scale 4-7).   prenatal multivitamin Tabs tablet Take 1 tablet by mouth daily at 12 noon. Start taking on: July 16, 2021   senna-docusate 8.6-50 MG tablet Commonly known as: Senokot-S Take 2 tablets by mouth daily. Start taking on: July 16, 2021   simethicone 80 MG chewable tablet Commonly known as: MYLICON Chew 1 tablet (80 mg total) by mouth as needed for flatulence.   witch hazel-glycerin pad Commonly known as: TUCKS Apply 1 application topically as needed for hemorrhoids (for pain).         Follow-up Information     Schermerhorn, Gwen Her, MD. Schedule an appointment as soon as possible for a visit  in 2 week(s).   Specialty: Obstetrics and Gynecology Why: post-op appointment Contact information: [place] [address] [phone]         Avelino Leeds Arlyn Leak, CNM Follow up in 6 week(s).   Specialty: Obstetrics Why: postpartum appointment Contact information: [place] [address] [phone]                 Signed: Francetta Found, CNM  07/15/2021 4:50 PM

## 2021-07-14 NOTE — Progress Notes (Signed)
Labor Progress Note  Veronica Hinton is a 31 y.o. [phone] at [redacted]w[redacted]d by LMP admitted for rupture of membranes  Subjective: resting in bed, appears to be sleeping with partner at bedside  Objective: BP (!) 110/55    Pulse 88    Temp 97.9 F (36.6 C) (Oral)    Resp 16    Ht 5\' 1"  (1.549 m)    Wt 89.4 kg    LMP 10/07/2020 (Approximate)    BMI 37.22 kg/m  Notable VS details:   Fetal Assessment: FHT:  FHR: 140 bpm, variability: moderate,  accelerations:  Present,  decelerations:  Absent, occasionally tracing maternal HR with position changes Category/reactivity:  Category I UC:   regular, every 2-5 minutes SVE:   5/60/-2 by RN Membrane status: SROM Amniotic color: Clear  Labs: Lab Results  Component Value Date   WBC 13.4 (H) 07/14/2021   HGB 11.5 (L) 07/14/2021   HCT 35.4 (L) 07/14/2021   MCV 83.5 07/14/2021   PLT 239 07/14/2021    Assessment / Plan: Labor progressing slowly,will initiate IV Pitocin to augment labor as needed for an effective contraction pattern conducive to labor  Labor:  Initiation of Pitocin for augmentation Preeclampsia:  labs stable and BP WNL Fetal Wellbeing:  Category I Pain Control:  IV pain meds I/D:   GBS neg, SROM x 7.5 hours Anticipated MOD:  NSVD   Avelino Leeds CNM

## 2021-07-14 NOTE — H&P (Signed)
OB History & Physical   History of Present Illness:  Chief Complaint:   HPI:  Veronica Hinton is a 31 y.o. [id number] female at [redacted]w[redacted]d dated by u/s at 7 weeks.  She presents to L&D for leaking fluid and contractions  She reports:  -active fetal movement -LOF/SROM at 0540 -no vaginal bleeding -onset of contractions at 0540 currently every 4-6 minutes  Pregnancy Issues: 1. Hypothyroidism, poor compliance, restarted medication Synthroid on 12/07/20 2. Obesity, BMI 35.7 3. 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  4. Tobacco use:  5. Elevated LFTs 01/24/21 6. Mental health/social issues   Maternal Medical History:   Past Medical History:  Diagnosis Date   Medical history non-contributory    Thyroid disease     Past Surgical History:  Procedure Laterality Date   EYE SURGERY      Allergies  Allergen Reactions   Latex Rash    Prior to Admission medications   Medication Sig Start Date End Date Taking? Authorizing Provider  levothyroxine (SYNTHROID) 125 MCG tablet Take 1 tablet (125 mcg total) by mouth daily at 6 (six) AM. 01/04/20  Yes Reggie Pile, MD  QUEtiapine (SEROQUEL) 100 MG tablet Take 1 tablet (100 mg total) by mouth at bedtime. Patient not taking: Reported on 07/10/2021 01/03/20   Reggie Pile, MD     Prenatal care site: [facility]   Social History: She  reports that she has been smoking cigarettes. She has been smoking an average of .5 packs per day. She has never used smokeless tobacco. She reports that she does not currently use alcohol. She reports that she does not currently use drugs after having used the following drugs: Cocaine and Methamphetamines.  Family History: family history includes Bipolar disorder in her mother; Schizophrenia in her mother.   Review of Systems: A full review of systems was performed and negative except as noted in the HPI.    Physical Exam:  Vital Signs: BP 130/75 (BP Location: Left Arm)     Pulse 95    Temp 98 F (36.7 C) (Oral)    Resp 16    LMP 10/07/2020 (Approximate)   General:   alert, cooperative, and appears stated age  Skin:  normal  Neurologic:    Alert & oriented x 3  Lungs:   clear to auscultation bilaterally  Heart:   regular rate and rhythm, S1, S2 normal, no murmur, click, rub or gallop  Abdomen:  soft, non-tender; bowel sounds normal; no masses,  no organomegaly  Pelvis:  Exam deferred.  FHT:  125 BPM  Presentations: cephalic  Cervix:    Dilation: 5   Effacement: 60   Station:  -2   Consistency: soft   Position: middle  Extremities: : non-tender, symmetric, no edema bilaterally.  DTRs: +2    EFW: 06/22/21: 6lb2oz 2766g =62 %  Pertinent Results:  Prenatal Labs: Blood type/Rh O pos  Antibody screen neg  Rubella Immune  Varicella Immune  RPR NR  HBsAg Neg  HIV NR  GC neg  Chlamydia neg  Genetic screening negative  1 hour GTT 129  3 hour GTT   GBS neg   FHT: FHR: 125 bpm, variability: moderate,  accelerations:  Present,  decelerations:  Absent Category/reactivity:  Category I TOCO: regular, every 5 minutes SVE: Dilation: 5 / Effacement (%): 60 / Station: -2     Assessment:  Avarey Yaeger is a 31 y.o. [id number] female at [redacted]w[redacted]d with leaking fluid and contractions.  Plan:  1. Admit to Labor & Delivery; consents reviewed and obtained  2. Fetal Well being  - Fetal Tracing: Cat I - GBS neg - Presentation: vtx confirmed by SVE   3. Routine OB: - Prenatal labs reviewed, as above - Rh pos - CBC & T&S on admit - Clear fluids, IVF  4. Monitoring of Labor -  Contractions by external toco in place -  Plan for continuous fetal monitoring  -  Maternal pain control as desired: IVPM, nitrous, regional anesthesia - Anticipate vaginal delivery  5. Post Partum Planning: - Infant feeding: TBD - Contraception: BTL - consent signed 06/22/21 - Tdap: not given - Flu: declined   Chari Manning, CNM

## 2021-07-14 NOTE — OB Triage Note (Signed)
Arrives via EMS with c/o UCs and c/o SROM around 0540.

## 2021-07-15 ENCOUNTER — Other Ambulatory Visit: Payer: Self-pay

## 2021-07-15 ENCOUNTER — Inpatient Hospital Stay: Payer: Medicaid Other | Admitting: Anesthesiology

## 2021-07-15 ENCOUNTER — Encounter
Admission: EM | Disposition: A | Discharge status: Home / Self Care | Payer: Self-pay | Source: Home / Self Care | Attending: Obstetrics

## 2021-07-15 ENCOUNTER — Encounter: Payer: Self-pay | Admitting: Obstetrics and Gynecology

## 2021-07-15 HISTORY — PX: TUBAL LIGATION: SHX77

## 2021-07-15 LAB — CBC
HCT: 31.4 % — ABNORMAL LOW (ref 36.0–46.0)
Hemoglobin: 10.2 g/dL — ABNORMAL LOW (ref 12.0–15.0)
MCH: 27.9 pg (ref 26.0–34.0)
MCHC: 32.5 g/dL (ref 30.0–36.0)
MCV: 85.8 fL (ref 80.0–100.0)
Platelets: 221 10*3/uL (ref 150–400)
RBC: 3.66 MIL/uL — ABNORMAL LOW (ref 3.87–5.11)
RDW: 14.2 % (ref 11.5–15.5)
WBC: 16.9 10*3/uL — ABNORMAL HIGH (ref 4.0–10.5)
nRBC: 0 % (ref 0.0–0.2)

## 2021-07-15 LAB — RPR: RPR Ser Ql: NONREACTIVE

## 2021-07-15 SURGERY — LIGATION, FALLOPIAN TUBE, POSTPARTUM
Anesthesia: General | Site: Abdomen | Laterality: Bilateral

## 2021-07-15 MED ORDER — BENZOCAINE-MENTHOL 20-0.5 % EX AERO
1.0000 | INHALATION_SPRAY | CUTANEOUS | Status: AC | PRN
Start: 2021-07-15 — End: ?

## 2021-07-15 MED ORDER — 0.9 % SODIUM CHLORIDE (POUR BTL) OPTIME
TOPICAL | Status: DC | PRN
Start: 2021-07-15 — End: 2021-07-15
  Administered 2021-07-15: 500 mL

## 2021-07-15 MED ORDER — ONDANSETRON HCL 4 MG PO TABS
4.0000 mg | ORAL_TABLET | Freq: Three times a day (TID) | ORAL | 0 refills | Status: AC | PRN
Start: 2021-07-15 — End: ?

## 2021-07-15 MED ORDER — OXYCODONE HCL 5 MG PO TABS
ORAL_TABLET | ORAL | Status: AC
  Filled 2021-07-15: qty 1

## 2021-07-15 MED ORDER — DEXAMETHASONE SODIUM PHOSPHATE 10 MG/ML IJ SOLN
INTRAMUSCULAR | Status: DC | PRN
  Administered 2021-07-15: 4 mg via INTRAVENOUS

## 2021-07-15 MED ORDER — MIDAZOLAM HCL 2 MG/2ML IJ SOLN
INTRAMUSCULAR | Status: AC
  Filled 2021-07-15: qty 2

## 2021-07-15 MED ORDER — PROMETHAZINE HCL 25 MG/ML IJ SOLN: 6.2500 mg | INTRAMUSCULAR | Status: DC | PRN

## 2021-07-15 MED ORDER — DEXAMETHASONE SODIUM PHOSPHATE 10 MG/ML IJ SOLN
INTRAMUSCULAR | Status: AC
  Filled 2021-07-15: qty 1

## 2021-07-15 MED ORDER — SIMETHICONE 80 MG PO CHEW
80.0000 mg | CHEWABLE_TABLET | ORAL | 0 refills | Status: AC | PRN
Start: 2021-07-15 — End: ?

## 2021-07-15 MED ORDER — OXYCODONE HCL 5 MG PO TABS: 5.0000 mg | ORAL_TABLET | Freq: Four times a day (QID) | ORAL | 0 refills | Status: AC | PRN

## 2021-07-15 MED ORDER — PROPOFOL 10 MG/ML IV BOLUS
INTRAVENOUS | Status: DC | PRN
  Administered 2021-07-15: 100 mg via INTRAVENOUS

## 2021-07-15 MED ORDER — ROCURONIUM BROMIDE 10 MG/ML (PF) SYRINGE
PREFILLED_SYRINGE | INTRAVENOUS | Status: AC
  Filled 2021-07-15: qty 10

## 2021-07-15 MED ORDER — SUCCINYLCHOLINE CHLORIDE 200 MG/10ML IV SOSY
PREFILLED_SYRINGE | INTRAVENOUS | Status: AC
  Filled 2021-07-15: qty 10

## 2021-07-15 MED ORDER — SENNOSIDES-DOCUSATE SODIUM 8.6-50 MG PO TABS
2.0000 | ORAL_TABLET | ORAL | 0 refills | Status: AC
Start: 2021-07-16 — End: ?

## 2021-07-15 MED ORDER — IBUPROFEN 600 MG PO TABS
600.0000 mg | ORAL_TABLET | Freq: Four times a day (QID) | ORAL | 0 refills | Status: AC
Start: 2021-07-16 — End: ?

## 2021-07-15 MED ORDER — PROPOFOL 500 MG/50ML IV EMUL
INTRAVENOUS | Status: AC
  Filled 2021-07-15: qty 50

## 2021-07-15 MED ORDER — LACTATED RINGERS IV SOLN: INTRAVENOUS | Status: DC | PRN

## 2021-07-15 MED ORDER — FENTANYL CITRATE (PF) 100 MCG/2ML IJ SOLN
INTRAMUSCULAR | Status: AC
  Filled 2021-07-15: qty 2

## 2021-07-15 MED ORDER — COCONUT OIL OIL
1.0000 | TOPICAL_OIL | 0 refills | Status: AC | PRN
Start: 2021-07-15 — End: ?

## 2021-07-15 MED ORDER — BUPIVACAINE HCL 0.5 % IJ SOLN
INTRAMUSCULAR | Status: DC | PRN
Start: 2021-07-15 — End: 2021-07-15
  Administered 2021-07-15: 4 mL

## 2021-07-15 MED ORDER — KETOROLAC TROMETHAMINE 30 MG/ML IJ SOLN
INTRAMUSCULAR | Status: DC | PRN
  Administered 2021-07-15: 30 mg via INTRAVENOUS

## 2021-07-15 MED ORDER — FENTANYL CITRATE (PF) 100 MCG/2ML IJ SOLN
INTRAMUSCULAR | Status: DC | PRN
  Administered 2021-07-15: 100 ug via INTRAVENOUS

## 2021-07-15 MED ORDER — PRENATAL MULTIVITAMIN CH
1.0000 | ORAL_TABLET | Freq: Every day | ORAL | Status: AC
Start: 2021-07-16 — End: ?

## 2021-07-15 MED ORDER — LIDOCAINE HCL (CARDIAC) PF 100 MG/5ML IV SOSY
PREFILLED_SYRINGE | INTRAVENOUS | Status: DC | PRN
  Administered 2021-07-15: 100 mg via INTRAVENOUS

## 2021-07-15 MED ORDER — SUCCINYLCHOLINE CHLORIDE 200 MG/10ML IV SOSY
PREFILLED_SYRINGE | INTRAVENOUS | Status: DC | PRN
  Administered 2021-07-15: 100 mg via INTRAVENOUS

## 2021-07-15 MED ORDER — ACETAMINOPHEN 325 MG PO TABS: 650.0000 mg | ORAL_TABLET | ORAL | 0 refills | Status: AC | PRN

## 2021-07-15 MED ORDER — WITCH HAZEL-GLYCERIN EX PADS: 1.0000 "application " | MEDICATED_PAD | CUTANEOUS | 12 refills | Status: AC | PRN

## 2021-07-15 MED ORDER — LIDOCAINE HCL (PF) 2 % IJ SOLN
INTRAMUSCULAR | Status: AC
  Filled 2021-07-15: qty 5

## 2021-07-15 MED ORDER — DIPHENHYDRAMINE HCL 25 MG PO CAPS
25.0000 mg | ORAL_CAPSULE | Freq: Four times a day (QID) | ORAL | 0 refills | Status: AC | PRN
Start: 2021-07-15 — End: ?

## 2021-07-15 MED ORDER — ONDANSETRON HCL 4 MG/2ML IJ SOLN
INTRAMUSCULAR | Status: AC
  Filled 2021-07-15: qty 2

## 2021-07-15 MED ORDER — SUGAMMADEX SODIUM 500 MG/5ML IV SOLN
INTRAVENOUS | Status: DC | PRN
  Administered 2021-07-15: 200 mg via INTRAVENOUS

## 2021-07-15 MED ORDER — OXYCODONE HCL 5 MG PO TABS
5.0000 mg | ORAL_TABLET | ORAL | Status: DC | PRN
Start: 2021-07-15 — End: 2021-07-15
  Administered 2021-07-15: 5 mg via ORAL

## 2021-07-15 MED ORDER — ONDANSETRON HCL 4 MG/2ML IJ SOLN
INTRAMUSCULAR | Status: DC | PRN
  Administered 2021-07-15: 4 mg via INTRAVENOUS

## 2021-07-15 MED ORDER — FENTANYL CITRATE (PF) 100 MCG/2ML IJ SOLN
25.0000 ug | INTRAMUSCULAR | Status: DC | PRN
  Administered 2021-07-15: 50 ug via INTRAVENOUS
  Administered 2021-07-15 (×2): 25 ug via INTRAVENOUS

## 2021-07-15 MED ORDER — MIDAZOLAM HCL 2 MG/2ML IJ SOLN
INTRAMUSCULAR | Status: DC | PRN
  Administered 2021-07-15: 2 mg via INTRAVENOUS

## 2021-07-15 MED ORDER — GLYCOPYRROLATE 0.2 MG/ML IJ SOLN
INTRAMUSCULAR | Status: DC | PRN
  Administered 2021-07-15: .1 mg via INTRAVENOUS

## 2021-07-15 MED ORDER — ROCURONIUM BROMIDE 100 MG/10ML IV SOLN
INTRAVENOUS | Status: DC | PRN
  Administered 2021-07-15: 20 mg via INTRAVENOUS
  Administered 2021-07-15: 10 mg via INTRAVENOUS

## 2021-07-15 MED ORDER — GLYCOPYRROLATE 0.2 MG/ML IJ SOLN
INTRAMUSCULAR | Status: AC
  Filled 2021-07-15: qty 1

## 2021-07-15 SURGICAL SUPPLY — 37 items
APPLICATOR COTTON TIP 6 STRL (MISCELLANEOUS) ×1 IMPLANT
APPLICATOR COTTON TIP 6IN STRL (MISCELLANEOUS) ×2 IMPLANT
BACTOSHIELD CHG 4% 4OZ (MISCELLANEOUS) ×1
BLADE SURG SZ11 CARB STEEL (BLADE) ×2 IMPLANT
CHLORAPREP W/TINT 26 (MISCELLANEOUS) ×2 IMPLANT
COVER LIGHT HANDLE STERIS (MISCELLANEOUS) ×1 IMPLANT
DERMABOND ADVANCED (GAUZE/BANDAGES/DRESSINGS) ×1
DERMABOND ADVANCED .7 DNX12 (GAUZE/BANDAGES/DRESSINGS) ×1 IMPLANT
DRAPE LAPAROTOMY 77X122 PED (DRAPES) ×2 IMPLANT
DRSG TEGADERM 2-3/8X2-3/4 SM (GAUZE/BANDAGES/DRESSINGS) ×1 IMPLANT
DRSG TEGADERM 4X4.75 (GAUZE/BANDAGES/DRESSINGS) IMPLANT
ELECT CAUTERY BLADE 6.4 (BLADE) ×2 IMPLANT
ELECT REM PT RETURN 9FT ADLT (ELECTROSURGICAL) ×2
ELECTRODE REM PT RTRN 9FT ADLT (ELECTROSURGICAL) ×1 IMPLANT
GAUZE 4X4 16PLY ~~LOC~~+RFID DBL (SPONGE) ×2 IMPLANT
GLOVE SURG SYN 8.0 (GLOVE) ×2 IMPLANT
GLOVE SURG SYN 8.0 PF PI (GLOVE) ×1 IMPLANT
GOWN STRL REUS W/ TWL LRG LVL3 (GOWN DISPOSABLE) ×1 IMPLANT
GOWN STRL REUS W/ TWL XL LVL3 (GOWN DISPOSABLE) ×1 IMPLANT
GOWN STRL REUS W/TWL LRG LVL3 (GOWN DISPOSABLE) ×1
GOWN STRL REUS W/TWL XL LVL3 (GOWN DISPOSABLE) ×1
KIT TURNOVER KIT A (KITS) ×2 IMPLANT
LABEL OR SOLS (LABEL) ×2 IMPLANT
MANIFOLD NEPTUNE II (INSTRUMENTS) ×2 IMPLANT
NEEDLE HYPO 22GX1.5 SAFETY (NEEDLE) ×2 IMPLANT
NS IRRIG 500ML POUR BTL (IV SOLUTION) ×2 IMPLANT
PACK BASIN MINOR ARMC (MISCELLANEOUS) ×2 IMPLANT
SCRUB CHG 4% DYNA-HEX 4OZ (MISCELLANEOUS) ×1 IMPLANT
SPONGE GAUZE 2X2 8PLY STRL LF (GAUZE/BANDAGES/DRESSINGS) ×2 IMPLANT
STRIP CLOSURE SKIN 1/4X4 (GAUZE/BANDAGES/DRESSINGS) IMPLANT
SUT PLAIN GUT 0 (SUTURE) ×4 IMPLANT
SUT VIC AB 2-0 UR6 27 (SUTURE) ×2 IMPLANT
SUT VIC AB 4-0 SH 27 (SUTURE) ×1
SUT VIC AB 4-0 SH 27XANBCTRL (SUTURE) ×1 IMPLANT
SWABSTK COMLB BENZOIN TINCTURE (MISCELLANEOUS) IMPLANT
SYR 10ML LL (SYRINGE) ×2 IMPLANT
WATER STERILE IRR 500ML POUR (IV SOLUTION) ×2 IMPLANT

## 2021-07-15 NOTE — Transfer of Care (Signed)
Immediate Anesthesia Transfer of Care Note  Patient: Veronica Hinton  Procedure(s) Performed: POST PARTUM TUBAL LIGATION (Bilateral: Abdomen)  Patient Location: PACU  Anesthesia Type:General  Level of Consciousness: awake  Airway & Oxygen Therapy: Patient Spontanous Breathing  Post-op Assessment: Report given to RN and Post -op Vital signs reviewed and stable  Post vital signs: Reviewed and stable  Last Vitals:  Vitals Value Taken Time  BP 126/70 07/15/21 1427  Temp    Pulse 83 07/15/21 1429  Resp 16 07/15/21 1429  SpO2 99 % 07/15/21 1429  Vitals shown include unvalidated device data.  Last Pain:  Vitals:   07/15/21 1208  TempSrc: Oral  PainSc:          Complications: No notable events documented.

## 2021-07-15 NOTE — Anesthesia Preprocedure Evaluation (Signed)
Anesthesia Evaluation  Patient identified by MRN, date of birth, ID band Patient awake    Reviewed: Allergy & Precautions, H&P , NPO status , Patient's Chart, lab work & pertinent test results, reviewed documented beta blocker date and time   History of Anesthesia Complications Negative for: history of anesthetic complications  Airway Mallampati: II  TM Distance: >3 FB Neck ROM: full    Dental  (+) Dental Advidsory Given, Teeth Intact, Missing   Pulmonary neg shortness of breath, neg COPD, neg recent URI, Current Smoker,    Pulmonary exam normal breath sounds clear to auscultation       Cardiovascular Exercise Tolerance: Good negative cardio ROS Normal cardiovascular exam Rhythm:regular Rate:Normal     Neuro/Psych PSYCHIATRIC DISORDERS Anxiety negative neurological ROS     GI/Hepatic Neg liver ROS, GERD  ,  Endo/Other  neg diabetesHypothyroidism   Renal/GU negative Renal ROS  negative genitourinary   Musculoskeletal   Abdominal   Peds  Hematology negative hematology ROS (+)   Anesthesia Other Findings Past Medical History: No date: Mental disorder No date: Thyroid disease   Reproductive/Obstetrics (+) Breast feeding                              Anesthesia Physical Anesthesia Plan  ASA: 2  Anesthesia Plan: General   Post-op Pain Management:    Induction: Intravenous  PONV Risk Score and Plan: 2 and Ondansetron, Dexamethasone, Midazolam, Promethazine and Treatment may vary due to age or medical condition  Airway Management Planned: Oral ETT  Additional Equipment:   Intra-op Plan:   Post-operative Plan: Extubation in OR  Informed Consent: I have reviewed the patients History and Physical, chart, labs and discussed the procedure including the risks, benefits and alternatives for the proposed anesthesia with the patient or authorized representative who has indicated his/her  understanding and acceptance.     Dental Advisory Given  Plan Discussed with: Anesthesiologist, CRNA and Surgeon  Anesthesia Plan Comments:         Anesthesia Quick Evaluation

## 2021-07-15 NOTE — Discharge Instructions (Addendum)

## 2021-07-15 NOTE — Op Note (Signed)
Veronica Hinton, Veronica Hinton MEDICAL RECORD NO: [id number] ACCOUNT NO: [id number] DATE OF BIRTH: 1990-12-23 FACILITY: [facility] LOCATION: [facility] PHYSICIAN: Suzy Bouchard, MD  Operative Report   DATE OF PROCEDURE: 07/15/2021  PREOPERATIVE DIAGNOSIS:  Elective permanent sterilization.  POSTOPERATIVE DIAGNOSIS:  Elective permanent sterilization.  PROCEDURE:  Bilateral tubal ligation--[place]oy.  ANESTHESIA:  General endotracheal anesthesia.  SURGEON:  Suzy Bouchard, MD  INDICATION:  A 31 year old gravida 6, now para 4, status post spontaneous vaginal delivery, the day before the procedure.  The patient elects for permanent sterilization and reconfirmed her desire the day of the procedure.  The patient is aware of the  failure rate of 1/300 per year.  DESCRIPTION OF PROCEDURE:  After adequate general endotracheal anesthesia, the patient was placed in dorsal supine position.  Abdomen was prepped and draped in normal sterile fashion.  Timeout was performed.  A 15 mm infraumbilical incision was made.   Fascia was identified and opened as well as the peritoneum.  The patient was placed in Trendelenburg and the right fallopian tube was identified and the fimbriated end was visualized.  Two separate 0 plain gut sutures were placed in the mid portion of  the fallopian tube and a 1.5 cm portion of fallopian tube was removed.  Good hemostasis noted.  Similar procedure was repeated on the patient's left fallopian tube after identifying the fimbriated end of the fallopian tube.  Two separate 0 plain gut  sutures were placed in the mid portion of the fallopian tube and a 1.5 cm portion of fallopian tube was removed.  Good hemostasis was noted.  The patient was flattened out and the fascia was closed with 2-0 Vicryl suture and the skin was reapproximated  with interrupted 4-0 Vicryl suture.  Sterile dressing applied.  There were no complications.  ESTIMATED BLOOD LOSS:  Minimal.  INTRAOPERATIVE  FLUIDS:  800 mL.   PUS D: 07/15/2021 2:32:47 pm T: 07/15/2021 6:13:00 pm  JOB: [id number]/ [id number]

## 2021-07-15 NOTE — TOC Progression Note (Addendum)
Transition of Care [facility]) - Progression Note    Patient Details  Name: Veronica Hinton MRN: [id number] Date of Birth: [birth year]  Transition of Care [facility]) CM/SW Contact  Merrily Brittle, [place] Phone Number: 07/15/2021, 10:46 AM  Clinical Narrative:    CSW spoke to patient and patient confirmed history of substance use and mental health issues. 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 Patient reported having strong support system and knowing what to do if she feels symptoms of PPD. Patient reported having PHP.  [facility] with plans to [facility]. Patient reported being stable with plans to reach back out to psychiatrist to continue depression medication. CSW offered mental health and substance use resources and patient accepted. Patient reported having essential baby items.  CSW completed CPS report for baby's positive marijuana drug screen. No further TOC needs.      Expected Discharge Plan and Services                                                 Social Determinants of Health (SDOH) Interventions    Readmission Risk Interventions No flowsheet data found.

## 2021-07-15 NOTE — Anesthesia Procedure Notes (Signed)
Procedure Name: Intubation Date/Time: 07/15/2021 1:46 PM Performed by: Orion Crook, CRNA Pre-anesthesia Checklist: Suction available, Emergency Drugs available, Patient being monitored, Timeout performed and Patient identified Patient Re-evaluated:Patient Re-evaluated prior to induction Oxygen Delivery Method: Circle system utilized Preoxygenation: Pre-oxygenation with 100% oxygen Induction Type: IV induction, Rapid sequence and Cricoid Pressure applied Laryngoscope Size: Mac and 3 Grade View: Grade I Tube type: Oral Tube size: 7.0 mm Number of attempts: 1 Placement Confirmation: ETT inserted through vocal cords under direct vision, positive ETCO2 and breath sounds checked- equal and bilateral Secured at: 21 cm Tube secured with: Tape Dental Injury: Teeth and Oropharynx as per pre-operative assessment

## 2021-07-15 NOTE — Progress Notes (Signed)
Ready for PP BTL , labs reviewed  All questions answered . Proceed 

## 2021-07-15 NOTE — Progress Notes (Signed)
Mother discharged.  Discharge instructions given.  Mother verbalizes understanding.  Transported by auxiliary.  

## 2021-07-15 NOTE — Progress Notes (Signed)
Post Partum Day 1 Subjective: Doing well, no complaints.  Tolerating regular diet, pain with PO meds, voiding and ambulating without difficulty.  No CP SOB Fever,Chills, N/V or leg pain; denies nipple or breast pain no HA change of vision, RUQ/epigastric pain  Objective: BP 112/81 (BP Location: Right Arm)    Pulse 62    Temp 97.8 F (36.6 C) (Oral)    Resp 18    Ht 5\' 1"  (1.549 m)    Wt 89.4 kg    LMP 10/07/2020 (Approximate)    SpO2 99%    Breastfeeding Unknown    BMI 37.22 kg/m    Physical Exam:  General: NAD Breasts: soft/nontender Pulm: nl effort Abdomen: soft, NT, BS x 4 Perineum: minimal edema, intact/lacerations hemostatic Lochia: moderate Uterine Fundus: fundus firm and 1 fb below umbilicus DVT Evaluation: no cords, ttp LEs   Recent Labs    07/14/21 0814 07/15/21 0629  HGB 11.5* 10.2*  HCT 35.4* 31.4*  WBC 13.4* 16.9*  PLT 239 221    Assessment/Plan: 30 y.o. 09/12/21 postpartum day # 1  - Continue routine PP care - Lactation consult prn - BTL scheduled for today.   - reviewed CBC, mild anemia r/t blood loss, elevated WBCs.  - Immunization status: all Imms up to date    Disposition: Does desire Dc home today after BTL if feeling well.      [id number], CNM 07/15/2021  8:29 AM

## 2021-07-15 NOTE — Brief Op Note (Signed)
07/15/2021  2:17 PM  PATIENT:  Veronica Hinton  31 y.o. female  PRE-OPERATIVE DIAGNOSIS:  Desires Sterilization  POST-OPERATIVE DIAGNOSIS:  Desires Sterilization  PROCEDURE:  Procedure(s): POST PARTUM TUBAL LIGATION (Bilateral)  SURGEON:  Surgeon(s) and Role:    * Quamesha Mullet, Gwen Her, MD - Primary  PHYSICIAN ASSISTANT:   ASSISTANTS: cst   ANESTHESIA:   general  EBL:  minimal IOF 800 cc  BLOOD ADMINISTERED:none  DRAINS: none   LOCAL MEDICATIONS USED:  MARCAINE     SPECIMEN:  Source of Specimen:  portion right and left tube  DISPOSITION OF SPECIMEN:  PATHOLOGY  COUNTS:  YES  TOURNIQUET:  * No tourniquets in log *  DICTATION: .Other Dictation: Dictation Number verbal  PLAN OF CARE:  cont PP care   PATIENT DISPOSITION:  PACU - hemodynamically stable.   Delay start of Pharmacological VTE agent (>24hrs) due to surgical blood loss or risk of bleeding: not applicable

## 2021-07-16 ENCOUNTER — Encounter: Payer: Self-pay | Admitting: Obstetrics and Gynecology

## 2021-07-16 NOTE — Anesthesia Postprocedure Evaluation (Signed)
Anesthesia Post Note  Patient: Veronica Hinton, Veronica Hinton  Procedure(s) Performed: POST PARTUM TUBAL LIGATION (Bilateral: Abdomen)  Patient location during evaluation: PACU Anesthesia Type: General Level of consciousness: awake and alert Pain management: pain level controlled Vital Signs Assessment: post-procedure vital signs reviewed and stable Respiratory status: spontaneous breathing, nonlabored ventilation, respiratory function stable and patient connected to nasal cannula oxygen Cardiovascular status: blood pressure returned to baseline and stable Postop Assessment: no apparent nausea or vomiting Anesthetic complications: no   No notable events documented.   Last Vitals:  Vitals:   07/15/21 1533 07/15/21 1625  BP: 100/64 105/74  Pulse: (!) 48 (!) 168  Resp: 16 16  Temp: 36.4 C 36.4 C  SpO2: 100% 100%    Last Pain:  Vitals:   07/15/21 1625  TempSrc: Oral  PainSc:                  Lenard Simmer

## 2021-07-18 LAB — SURGICAL PATHOLOGY

## 2021-07-25 ENCOUNTER — Other Ambulatory Visit: Payer: Self-pay

## 2021-08-03 ENCOUNTER — Telehealth: Payer: Self-pay | Admitting: Licensed Clinical Social Worker

## 2021-08-03 NOTE — Telephone Encounter (Signed)
-----   Message from Esmeralda Links, RN sent at 08/03/2021  9:50 AM EST ----- ?Good morning Marchelle Folks I hope that you are having a great day. Would you please reach out to [facility] as soon as possible. She is having some postpartum depression. Dr. Feliberto Gottron started her on Celexa and Buspar yesterday. EPDS in office was 14. ? ? ?Thank you ? ?Cassandra ? ?

## 2021-08-17 ENCOUNTER — Ambulatory Visit: Payer: No Typology Code available for payment source | Admitting: Licensed Clinical Social Worker

## 2021-12-16 ENCOUNTER — Encounter: Payer: Self-pay | Admitting: Emergency Medicine

## 2021-12-16 ENCOUNTER — Emergency Department: Payer: Medicaid Other

## 2021-12-16 ENCOUNTER — Other Ambulatory Visit: Payer: Self-pay

## 2021-12-16 ENCOUNTER — Emergency Department
Admission: EM | Admit: 2021-12-16 | Discharge: 2021-12-16 | Disposition: A | Discharge status: Home / Self Care | Payer: Medicaid Other | Attending: Emergency Medicine | Admitting: Emergency Medicine

## 2021-12-16 DIAGNOSIS — M542 Cervicalgia: Secondary | ICD-10-CM | POA: Insufficient documentation

## 2021-12-16 DIAGNOSIS — N898 Other specified noninflammatory disorders of vagina: Secondary | ICD-10-CM | POA: Diagnosis not present

## 2021-12-16 DIAGNOSIS — M545 Low back pain, unspecified: Secondary | ICD-10-CM

## 2021-12-16 LAB — URINALYSIS, ROUTINE W REFLEX MICROSCOPIC
Bilirubin Urine: NEGATIVE
Glucose, UA: NEGATIVE mg/dL
Hgb urine dipstick: NEGATIVE
Ketones, ur: NEGATIVE mg/dL
Leukocytes,Ua: NEGATIVE
Nitrite: NEGATIVE
Protein, ur: NEGATIVE mg/dL
Specific Gravity, Urine: 1.032 — ABNORMAL HIGH (ref 1.005–1.030)
pH: 6 (ref 5.0–8.0)

## 2021-12-16 LAB — POC URINE PREG, ED: Preg Test, Ur: NEGATIVE

## 2021-12-16 MED ORDER — MELOXICAM 15 MG PO TABS: 15.0000 mg | ORAL_TABLET | Freq: Every day | ORAL | 0 refills | Status: AC

## 2021-12-16 MED ORDER — KETOROLAC TROMETHAMINE 30 MG/ML IJ SOLN
30.0000 mg | Freq: Once | INTRAMUSCULAR | Status: AC
  Administered 2021-12-16: 30 mg via INTRAMUSCULAR
  Filled 2021-12-16: qty 1

## 2021-12-16 MED ORDER — METHOCARBAMOL 500 MG PO TABS
1000.0000 mg | ORAL_TABLET | Freq: Once | ORAL | Status: AC
  Administered 2021-12-16: 1000 mg via ORAL
  Filled 2021-12-16: qty 2

## 2021-12-16 MED ORDER — METHOCARBAMOL 500 MG PO TABS: 500.0000 mg | ORAL_TABLET | Freq: Four times a day (QID) | ORAL | 0 refills | Status: AC

## 2021-12-16 NOTE — ED Triage Notes (Signed)
Pt via POV from home. Pt c/o lower back pain and vaginal discharge for the past 2 days, pt states it seems like a yeast infection, pt states she took a medication that was given to her when she had a yeast infection when she was 5-6 months pregnant approx 9 months ago. Deneis any dysuria. Pt is A&Ox4 and NAD

## 2021-12-16 NOTE — ED Provider Notes (Signed)
[facility] Provider Note  Patient Contact: 7:23 PM (approximate)   History   Back Pain and Vaginal Discharge   HPI  Veronica Hinton is a 31 y.o. female who presents the emergency department complaining of lower back pain radiating into her pelvic region.  Patient has a longstanding history of back pain since the birth of her daughter roughly 13 years ago.  Off-and-on symptoms.  The patient denies any dysuria, polyuria, hematuria, vaginal bleeding or discharge.  Initial note from triage noted positive vaginal discharge and concern for yeast infection, however patient clarifies that she had a recent yeast infection and the symptoms are not similar.  She has no ongoing symptoms of yeast infection and again has no vaginal discharge, bleeding, or urinary symptoms.  Patient denies any bowel or bladder dysfunction, saddle anesthesia, paresthesias.  No direct trauma to her back.  No other complaints at this time.     Physical Exam   Triage Vital Signs: ED Triage Vitals  Enc Vitals Group     BP 12/16/21 1758 127/82     Pulse Rate 12/16/21 1758 78     Resp 12/16/21 1758 20     Temp 12/16/21 1758 98.5 F (36.9 C)     Temp src --      SpO2 12/16/21 1758 98 %     Weight 12/16/21 1755 160 lb (72.6 kg)     Height 12/16/21 1755 5\' 1"  (1.549 m)     Head Circumference --      Peak Flow --      Pain Score 12/16/21 1755 9     Pain Loc --      Pain Edu? --      Excl. in GC? --     Most recent vital signs: Vitals:   12/16/21 1758  BP: 127/82  Pulse: 78  Resp: 20  Temp: 98.5 F (36.9 C)  SpO2: 98%     General: Alert and in no acute distress.   Cardiovascular:  Good peripheral perfusion Respiratory: Normal respiratory effort without tachypnea or retractions. Lungs CTAB.  Gastrointestinal: Bowel sounds 4 quadrants. Soft and nontender to palpation. No guarding or rigidity. No palpable masses. No distention. No CVA tenderness. Musculoskeletal: Full range of  motion to all extremities.  No visible signs of trauma to the lumbar spine.  Tenderness both midline and paraspinal muscles with appreciable spasms.  No extension into the SI joint or sciatic notch.  Gait is equal with no limping.  No distal weakness.  Sensation and pulses intact distally. Neurologic:  No gross focal neurologic deficits are appreciated.  Skin:   No rash noted Other:   ED Results / Procedures / Treatments   Labs (all labs ordered are listed, but only abnormal results are displayed) Labs Reviewed  URINALYSIS, ROUTINE W REFLEX MICROSCOPIC - Abnormal; Notable for the following components:      Result Value   Color, Urine YELLOW (*)    APPearance HAZY (*)    Specific Gravity, Urine 1.032 (*)    All other components within normal limits  POC URINE PREG, ED     EKG     RADIOLOGY  I personally viewed, evaluated, and interpreted these images as part of my medical decision making, as well as reviewing the written report by the radiologist.  ED Provider Interpretation: No acute traumatic findings, straightening of the normal cervical lordosis  DG Lumbar Spine 2-3 Views  Result Date: 12/16/2021 CLINICAL DATA:  Atraumatic low back pain. EXAM:  LUMBAR SPINE - 2-3 VIEW COMPARISON:  None Available. FINDINGS: There are 5 non-rib-bearing lumbar vertebra. Slight straightening of normal lordosis. No listhesis. Normal vertebral body heights. No significant disc space narrowing. No evidence of fracture, focal bone abnormality or pars defects. The sacroiliac joints are congruent. IMPRESSION: Slight straightening of normal lordosis, can be seen with muscle spasm. Otherwise negative radiographs of the lumbar spine. Electronically Signed   By: Narda Rutherford M.D.   On: 12/16/2021 20:01    PROCEDURES:  Critical Care performed: No  Procedures   MEDICATIONS ORDERED IN ED: Medications  ketorolac (TORADOL) 30 MG/ML injection 30 mg (30 mg Intramuscular Given 12/16/21 2021)   methocarbamol (ROBAXIN) tablet 1,000 mg (1,000 mg Oral Given 12/16/21 2019)     IMPRESSION / MDM / ASSESSMENT AND PLAN / ED COURSE  I reviewed the triage vital signs and the nursing notes.                              Differential diagnosis includes, but is not limited to, lumbago, UTI, pyelonephritis, nephrolithiasis, herniated disc, PID, BV, candidal infection  Patient's presentation is most consistent with acute presentation with potential threat to life or bodily function.   Patient's diagnosis is consistent with lumbago.  Patient presents to the emergency department lower back pain that occasionally radiates into her pelvic region.  Initial triage note had noted that she had positive discharge and concern for yeast infection however patient states that she has no discharge, no vaginal bleeding, no urinary symptoms but wanted triage note that she had recently been treated for a candidal infection with no ongoing symptoms.  Urinalysis is reassuring.  Findings on physical exam were concerning for musculoskeletal source.  She does have straightening of the normal lumbar lordosis on x-ray without other acute findings.  Patient will be treated for musculoskeletal back pain with anti-inflammatory and muscle relaxer.  Follow-up with primary care as needed..  Patient is given ED precautions to return to the ED for any worsening or new symptoms.        FINAL CLINICAL IMPRESSION(S) / ED DIAGNOSES   Final diagnoses:  Acute midline low back pain without sciatica     Rx / DC Orders   ED Discharge Orders          Ordered    meloxicam (MOBIC) 15 MG tablet  Daily        12/16/21 2013    methocarbamol (ROBAXIN) 500 MG tablet  4 times daily        12/16/21 2013             Note:  This document was prepared using Dragon voice recognition software and may include unintentional dictation errors.   Lanette Hampshire 12/16/21 2024    Shaune Pollack, MD 12/16/21 2112

## 2021-12-16 NOTE — ED Notes (Signed)
Patient declined discharge vital signs. 

## 2022-09-23 ENCOUNTER — Emergency Department
Admission: EM | Admit: 2022-09-23 | Discharge: 2022-09-23 | Disposition: A | Discharge status: Home / Self Care | Payer: Medicaid Other | Attending: Emergency Medicine | Admitting: Emergency Medicine

## 2022-09-23 DIAGNOSIS — K029 Dental caries, unspecified: Secondary | ICD-10-CM | POA: Diagnosis not present

## 2022-09-23 DIAGNOSIS — E039 Hypothyroidism, unspecified: Secondary | ICD-10-CM | POA: Insufficient documentation

## 2022-09-23 DIAGNOSIS — T39311A Poisoning by propionic acid derivatives, accidental (unintentional), initial encounter: Secondary | ICD-10-CM | POA: Insufficient documentation

## 2022-09-23 DIAGNOSIS — R112 Nausea with vomiting, unspecified: Secondary | ICD-10-CM | POA: Insufficient documentation

## 2022-09-23 DIAGNOSIS — K0889 Other specified disorders of teeth and supporting structures: Secondary | ICD-10-CM | POA: Diagnosis present

## 2022-09-23 LAB — CBC WITH DIFFERENTIAL/PLATELET
Abs Immature Granulocytes: 0.03 10*3/uL (ref 0.00–0.07)
Basophils Absolute: 0.1 10*3/uL (ref 0.0–0.1)
Basophils Relative: 1 %
Eosinophils Absolute: 0.3 10*3/uL (ref 0.0–0.5)
Eosinophils Relative: 3 %
HCT: 42.8 % (ref 36.0–46.0)
Hemoglobin: 13.3 g/dL (ref 12.0–15.0)
Immature Granulocytes: 0 %
Lymphocytes Relative: 27 %
Lymphs Abs: 2.6 10*3/uL (ref 0.7–4.0)
MCH: 27.5 pg (ref 26.0–34.0)
MCHC: 31.1 g/dL (ref 30.0–36.0)
MCV: 88.6 fL (ref 80.0–100.0)
Monocytes Absolute: 0.8 10*3/uL (ref 0.1–1.0)
Monocytes Relative: 9 %
Neutro Abs: 5.6 10*3/uL (ref 1.7–7.7)
Neutrophils Relative %: 60 %
Platelets: 272 10*3/uL (ref 150–400)
RBC: 4.83 MIL/uL (ref 3.87–5.11)
RDW: 14.4 % (ref 11.5–15.5)
WBC: 9.4 10*3/uL (ref 4.0–10.5)
nRBC: 0 % (ref 0.0–0.2)

## 2022-09-23 LAB — BASIC METABOLIC PANEL
Anion gap: 7 (ref 5–15)
BUN: 20 mg/dL (ref 6–20)
CO2: 23 mmol/L (ref 22–32)
Calcium: 9 mg/dL (ref 8.9–10.3)
Chloride: 109 mmol/L (ref 98–111)
Creatinine, Ser: 0.72 mg/dL (ref 0.44–1.00)
GFR, Estimated: >60 mL/min (ref >60)
Glucose, Bld: 99 mg/dL (ref 70–99)
Potassium: 3.9 mmol/L (ref 3.5–5.1)
Sodium: 139 mmol/L (ref 135–145)

## 2022-09-23 LAB — ACETAMINOPHEN LEVEL: Acetaminophen (Tylenol), Serum: <10 ug/mL — ABNORMAL LOW (ref 10–30)

## 2022-09-23 LAB — HEPATIC FUNCTION PANEL
ALT: 15 U/L (ref 0–44)
AST: 16 U/L (ref 15–41)
Albumin: 3.8 g/dL (ref 3.5–5.0)
Alkaline Phosphatase: 53 U/L (ref 38–126)
Bilirubin, Direct: <0.1 mg/dL (ref 0.0–0.2)
Total Bilirubin: 0.3 mg/dL (ref 0.3–1.2)
Total Protein: 7.2 g/dL (ref 6.5–8.1)

## 2022-09-23 LAB — HCG, QUANTITATIVE, PREGNANCY: hCG, Beta Chain, Quant, S: <1 m[IU]/mL (ref <5)

## 2022-09-23 LAB — SALICYLATE LEVEL: Salicylate Lvl: <7 mg/dL — ABNORMAL LOW (ref 7.0–30.0)

## 2022-09-23 MED ORDER — ACETAMINOPHEN 500 MG PO TABS: 1000.0000 mg | ORAL_TABLET | Freq: Once | ORAL | Status: DC

## 2022-09-23 MED ORDER — PENICILLIN V POTASSIUM 500 MG PO TABS: 500.0000 mg | ORAL_TABLET | Freq: Four times a day (QID) | ORAL | 0 refills | Status: AC

## 2022-09-23 MED ORDER — ONDANSETRON 4 MG PO TBDP: 4.0000 mg | ORAL_TABLET | Freq: Four times a day (QID) | ORAL | 0 refills | Status: AC | PRN

## 2022-09-23 MED ORDER — PENICILLIN V POTASSIUM 250 MG PO TABS
500.0000 mg | ORAL_TABLET | Freq: Once | ORAL | Status: AC
  Administered 2022-09-23: 500 mg via ORAL
  Filled 2022-09-23: qty 2

## 2022-09-23 MED ORDER — SODIUM CHLORIDE 0.9 % IV BOLUS (SEPSIS)
1000.0000 mL | Freq: Once | INTRAVENOUS | Status: AC
  Administered 2022-09-23: 1000 mL via INTRAVENOUS

## 2022-09-23 MED ORDER — ONDANSETRON 4 MG PO TBDP: 4.0000 mg | ORAL_TABLET | Freq: Once | ORAL | Status: DC

## 2022-09-23 MED ORDER — TRAMADOL HCL 50 MG PO TABS: 50.0000 mg | ORAL_TABLET | Freq: Four times a day (QID) | ORAL | 0 refills | Status: AC | PRN

## 2022-09-23 MED ORDER — ONDANSETRON HCL 4 MG/2ML IJ SOLN
4.0000 mg | Freq: Once | INTRAMUSCULAR | Status: AC
  Administered 2022-09-23: 4 mg via INTRAVENOUS
  Filled 2022-09-23: qty 2

## 2022-09-23 MED ORDER — FAMOTIDINE 20 MG PO TABS
40.0000 mg | ORAL_TABLET | Freq: Once | ORAL | Status: AC
  Administered 2022-09-23: 40 mg via ORAL
  Filled 2022-09-23: qty 2

## 2022-09-23 MED ORDER — METOCLOPRAMIDE HCL 10 MG PO TABS
10.0000 mg | ORAL_TABLET | Freq: Once | ORAL | Status: AC
  Administered 2022-09-23: 10 mg via ORAL
  Filled 2022-09-23: qty 1

## 2022-09-23 MED ORDER — ALUM & MAG HYDROXIDE-SIMETH 200-200-20 MG/5ML PO SUSP
30.0000 mL | Freq: Once | ORAL | Status: AC
  Administered 2022-09-23: 30 mL via ORAL
  Filled 2022-09-23: qty 30

## 2022-09-23 MED ORDER — PANTOPRAZOLE SODIUM 40 MG PO TBEC
40.0000 mg | DELAYED_RELEASE_TABLET | Freq: Once | ORAL | Status: AC
  Administered 2022-09-23: 40 mg via ORAL
  Filled 2022-09-23: qty 1

## 2022-09-23 NOTE — Discharge Instructions (Addendum)
Please do not take any more naproxen, ibuprofen, Aleve, aspirin, Goody powders or other NSAIDs at this time.  In the future, I recommend reading labels on bottles before taking any medications as some over-the-counter medications can be very dangerous if you were to overdose.  I recommend close follow-up with a dentist for your dental pain and dental decay.  Please take your antibiotics as prescribed.  You are being provided a prescription for opiates (also known as narcotics) for pain control.  Opiates can be addictive and should only be used when absolutely necessary for pain control when other alternatives do not work.  We recommend you only use them for the recommended amount of time and only as prescribed.  Please do not take with other sedative medications or alcohol.  Please do not drive, operate machinery, make important decisions while taking opiates.  Please note that these medications can be addictive and have high abuse potential.  Patients can become addicted to narcotics after only taking them for a few days.  Please keep these medications locked away from children, teenagers or any family members with history of substance abuse.  Narcotic pain medicine may also make you constipated.  You may use over-the-counter medications such as MiraLAX, Colace to prevent constipation.  If you become constipated, you may use over-the-counter enemas as needed.  Itching and nausea are also common side effects of narcotic pain medication.  If you develop uncontrolled vomiting or a rash, please stop these medications and seek medical care.

## 2022-09-23 NOTE — ED Notes (Signed)
Poison control has been contacted.  They suggested running a BMP and CBC with an acetaminophen level.  They asked about EKG and any test results already returned.  Because the CO2 level was normal on the BMP, they did not suggest repeating this lab.  They did recommend observing the patient for the next 4 hours.  Dr. Elesa Massed has been updated.

## 2022-09-23 NOTE — ED Notes (Signed)
Pt continues to complain of burning sensation in throat. Given ice water to see if that helps.

## 2022-09-23 NOTE — ED Triage Notes (Addendum)
Patient C/O right sided dental pain and swelling that began around 8pm last night. She states that she's unsure if she has an abscess, or if there is an infected tooth, etc., and is unable to tell if it's on the top or bottom of her mouth. Patient states that she took 2 aleve at 6pm yesterday evening. The pain did not stop, so at 9:00pm, patient states she took 5 more. Then at 0330 this morning, patient states she woke up and the pain was still there so she took 5 more. She has thrown up twice, and has had two episodes of diarrhea

## 2022-09-23 NOTE — ED Provider Notes (Signed)
[facility] Provider Note    Event Date/Time   First MD Initiated Contact with Patient 09/23/22 0559     (approximate)   History   Dental Pain and Drug Overdose   HPI  Veronica Hinton is a 32 y.o. female with no significant past medical history who presents to the emergency department with complaints of dental pain in the right lower and upper teeth.  No facial swelling.  No fever.  She states due to the pain she has been taking over-the-counter naproxen.  States she thinks she took approximately 12 tablets of naproxen 220 mg in the last 6 hours.  Took 5 tablets at 3:30 AM.  She was not trying to harm herself.  She denies any other coingestions.  She states now she is having nausea and vomiting and abdominal discomfort.  She denies any bloody stools or melena.  She does not have a local dentist.   History provided by patient and significant other.    Past Medical History:  Diagnosis Date   Mental disorder    Thyroid disease     Past Surgical History:  Procedure Laterality Date   EYE SURGERY     TUBAL LIGATION Bilateral 07/15/2021   Procedure: POST PARTUM TUBAL LIGATION;  Surgeon: Schermerhorn, Ihor Austin, MD;  Location: [facility] ORS;  Service: Gynecology;  Laterality: Bilateral;    MEDICATIONS:  Prior to Admission medications   Medication Sig Start Date End Date Taking? Authorizing Provider  acetaminophen (TYLENOL) 325 MG tablet Take 2 tablets (650 mg total) by mouth every 4 (four) hours as needed (for pain scale < 4). 07/15/21   McVey, Prudencio Pair, CNM  benzocaine-Menthol (DERMOPLAST) 20-0.5 % AERO Apply 1 application topically as needed for irritation (perineal discomfort). 07/15/21   McVey, Prudencio Pair, CNM  coconut oil OIL Apply 1 application topically as needed. 07/15/21   McVey, Prudencio Pair, CNM  diphenhydrAMINE (BENADRYL) 25 mg capsule Take 1 capsule (25 mg total) by mouth every 6 (six) hours as needed for itching. 07/15/21   McVey, Prudencio Pair, CNM  ibuprofen  (ADVIL) 600 MG tablet Take 1 tablet (600 mg total) by mouth every 6 (six) hours. 07/16/21   McVey, Prudencio Pair, CNM  levothyroxine (SYNTHROID) 125 MCG tablet Take 1 tablet (125 mcg total) by mouth daily at 6 (six) AM. 01/04/20   Reggie Pile, MD  meloxicam (MOBIC) 15 MG tablet Take 1 tablet (15 mg total) by mouth daily. 12/16/21 12/16/22  Cuthriell, Delorise Royals, PA-C  methocarbamol (ROBAXIN) 500 MG tablet Take 1 tablet (500 mg total) by mouth 4 (four) times daily. 12/16/21   Cuthriell, Delorise Royals, PA-C  ondansetron (ZOFRAN) 4 MG tablet Take 1 tablet (4 mg total) by mouth every 8 (eight) hours as needed for nausea. 07/15/21   McVey, Prudencio Pair, CNM  Prenatal Vit-Fe Fumarate-FA (PRENATAL MULTIVITAMIN) TABS tablet Take 1 tablet by mouth daily at 12 noon. 07/16/21   McVey, Prudencio Pair, CNM  senna-docusate (SENOKOT-S) 8.6-50 MG tablet Take 2 tablets by mouth daily. 07/16/21   McVey, Prudencio Pair, CNM  simethicone (MYLICON) 80 MG chewable tablet Chew 1 tablet (80 mg total) by mouth as needed for flatulence. 07/15/21   McVey, Prudencio Pair, CNM  witch hazel-glycerin (TUCKS) pad Apply 1 application topically as needed for hemorrhoids (for pain). 07/15/21   McVey, Prudencio Pair, CNM    Physical Exam   Triage Vital Signs: ED Triage Vitals  Enc Vitals Group     BP 09/23/22 0528 128/68  Pulse Rate 09/23/22 0528 68     Resp 09/23/22 0528 18     Temp 09/23/22 0528 98.6 F (37 C)     Temp Source 09/23/22 0528 Oral     SpO2 09/23/22 0528 98 %     Weight 09/23/22 0529 160 lb (72.6 kg)     Height 09/23/22 0529  (1.549 m)     Head Circumference --      Peak Flow --      Pain Score --      Pain Loc --      Pain Edu? --      Excl. in GC? --     Most recent vital signs: Vitals:   09/23/22 0730 09/23/22 0800  BP: 110/70 106/62  Pulse: (!) 50 (!) 53  Resp: 16 16  Temp:    SpO2: 100% 100%    CONSTITUTIONAL: Alert, responds appropriately to questions. Well-appearing; well-nourished HEAD: Normocephalic,  atraumatic EYES: Conjunctivae clear, pupils appear equal, sclera nonicteric ENT: normal nose; moist mucous membranes, Dental caries present, no drainable dental abscess noted, gums appear normal without bleeding, no Ludwig's angina, tongue sits flat in the bottom of the mouth, no angioedema, no facial erythema or warmth, no facial swelling, moves neck without difficulty, normal phonation without stridor, trismus or drooling. NECK: Supple, normal ROM CARD: RRR; S1 and S2 appreciated RESP: Normal chest excursion without splinting or tachypnea; breath sounds clear and equal bilaterally; no wheezes, no rhonchi, no rales, no hypoxia or respiratory distress, speaking full sentences ABD/GI: Non-distended; soft, non-tender, no rebound, no guarding, no peritoneal signs BACK: The back appears normal EXT: Normal ROM in all joints; no deformity noted, no edema SKIN: Normal color for age and race; warm; no rash on exposed skin NEURO: Moves all extremities equally, normal speech PSYCH: The patient's mood and manner are appropriate.  Denies intentional overdose.   ED Results / Procedures / Treatments   LABS: (all labs ordered are listed, but only abnormal results are displayed) Labs Reviewed  ACETAMINOPHEN LEVEL - Abnormal; Notable for the following components:      Result Value   Acetaminophen (Tylenol), Serum <10 (*)    All other components within normal limits  SALICYLATE LEVEL - Abnormal; Notable for the following components:   Salicylate Lvl <7.0 (*)    All other components within normal limits  CBC WITH DIFFERENTIAL/PLATELET  BASIC METABOLIC PANEL  HCG, QUANTITATIVE, PREGNANCY  HEPATIC FUNCTION PANEL     EKG:  EKG Interpretation  Date/Time:  Sunday September 23 2022 05:51:09 EDT Ventricular Rate:  55 PR Interval:  124 QRS Duration: 76 QT Interval:  388 QTC Calculation: 371 R Axis:   54 Text Interpretation: Sinus bradycardia with sinus arrhythmia Possible Anterior infarct (cited on or  before 21-Nov-2020) Abnormal ECG When compared with ECG of 21-Nov-2020 18:05, Vent. rate has decreased BY  31 BPM QT has shortened Confirmed by Rochele Raring [phone]) on 09/23/2022 6:24:45 AM         RADIOLOGY: My personal review and interpretation of imaging:    I have personally reviewed all radiology reports.   No results found.   PROCEDURES:  Critical Care performed: No      .1-3 Lead EKG Interpretation  Performed by: Kimiyah Blick, Layla Maw, DO Authorized by: Shyanne Mcclary, Layla Maw, DO     Interpretation: abnormal     ECG rate:  55   ECG rate assessment: bradycardic     Rhythm: sinus bradycardia     Ectopy: none  Conduction: normal       IMPRESSION / MDM / ASSESSMENT AND PLAN / ED COURSE  I reviewed the triage vital signs and the nursing notes.    Patient here with dental pain with unintentional overdose on naproxen.  The patient is on the cardiac monitor to evaluate for evidence of arrhythmia and/or significant heart rate changes.   DIFFERENTIAL DIAGNOSIS (includes but not limited to):   Dental caries causing dental pain.  Odontogenic abscess.  No sign of Ludwig's angina, facial cellulitis, airway obstruction.  Patient here with unintentional naproxen overdose.  Doubt suicidal intent.  Having GI upset.  Denies any other coingestions.   Patient's presentation is most consistent with acute presentation with potential threat to life or bodily function.   PLAN: Discussed with poison control.  Labs initiated from triage.  Normal creatinine and bicarb.  Poison control recommends 4-hour obs in the ED.  Will give Zofran, Mylanta, Protonix for symptomatic relief.  Will obtain Tylenol and salicylate level.  EKG shows no interval abnormality or arrhythmia.  Will discharge on penicillin prophylactically for dental pain with short course of narcotic analgesia and close outpatient dental follow-up.   MEDICATIONS GIVEN IN ED: Medications  pantoprazole (PROTONIX) EC tablet 40 mg  (40 mg Oral Given 09/23/22 0643)  alum & mag hydroxide-simeth (MAALOX/MYLANTA) 200-200-20 MG/5ML suspension 30 mL (30 mLs Oral Given 09/23/22 0643)  ondansetron (ZOFRAN) injection 4 mg (4 mg Intravenous Given 09/23/22 0642)  sodium chloride 0.9 % bolus 1,000 mL (0 mLs Intravenous Stopped 09/23/22 0845)  penicillin v potassium (VEETID) tablet 500 mg (500 mg Oral Given 09/23/22 0643)  famotidine (PEPCID) tablet 40 mg (40 mg Oral Given 09/23/22 0854)  metoCLOPramide (REGLAN) tablet 10 mg (10 mg Oral Given 09/23/22 0854)     ED COURSE: Tylenol and salicylate levels negative.  Normal LFTs.  Patient tolerating p.o.  Continues to be hemodynamically stable.  Anticipate discharge home at the end of 4-hour observation in the emergency department.  Have instructed patient to only take directed amount of over-the-counter medications in the future as we discussed overdoses on medication such as Tylenol, aspirin can be life-threatening.  No indication for psychiatric evaluation as I do not think that this was intentional.   CONSULTS:  none   OUTSIDE RECORDS REVIEWED: Reviewed last family medicine visit for dental infection on 07/04/2022.       FINAL CLINICAL IMPRESSION(S) / ED DIAGNOSES   Final diagnoses:  Pain due to dental caries  Accidental naproxen overdose, initial encounter  Nausea and vomiting in adult     Rx / DC Orders   ED Discharge Orders          Ordered    traMADol (ULTRAM) 50 MG tablet  Every 6 hours PRN        09/23/22 0654    ondansetron (ZOFRAN-ODT) 4 MG disintegrating tablet  Every 6 hours PRN        09/23/22 0654    penicillin v potassium (VEETID) 500 MG tablet  4 times daily        09/23/22 0654    Ambulatory Referral to Primary Care (Establish Care)        09/23/22 0741             Note:  This document was prepared using Dragon voice recognition software and may include unintentional dictation errors.   Mariellen Blaney, Layla Maw, DO 09/23/22 [phone]

## 2022-10-14 IMAGING — US US OB < 14 WEEKS - US OB TV
1 series · 14 of 28 positions shown · non-contrast
Comparison: None.

CLINICAL DATA: Vaginal bleeding. Pregnant patient. Patient is 6
weeks and 3 days pregnant based on her last menstrual period.

EXAM:
OBSTETRIC <14 WK US AND TRANSVAGINAL OB US
TECHNIQUE: Both transabdominal and transvaginal ultrasound examinations were
performed for complete evaluation of the gestation as well as the
maternal uterus, adnexal regions, and pelvic cul-de-sac.
Transvaginal technique was performed to assess early pregnancy.

[Series 1: us ob less than 14 weeks with ob transvaginal · 14 of 156 slices shown]
[im 6/156]
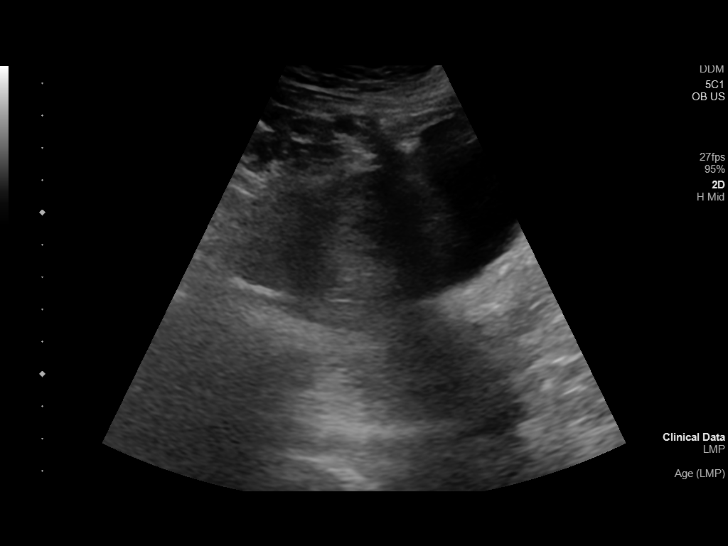
[im 18/156]
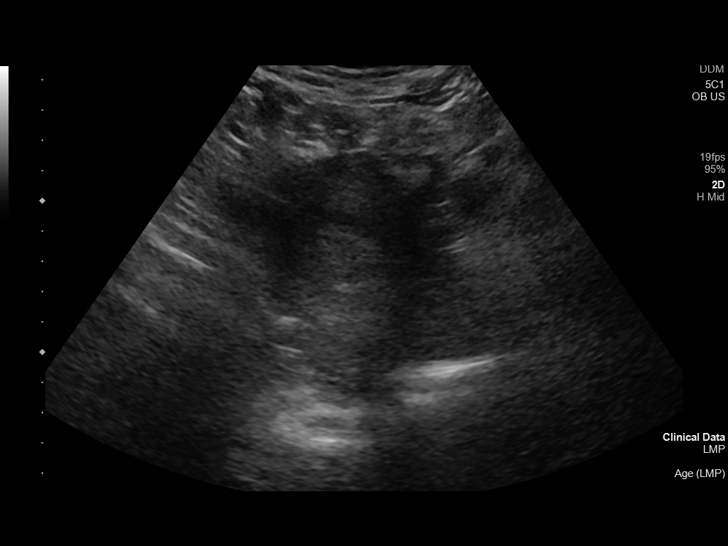
[im 29/156]
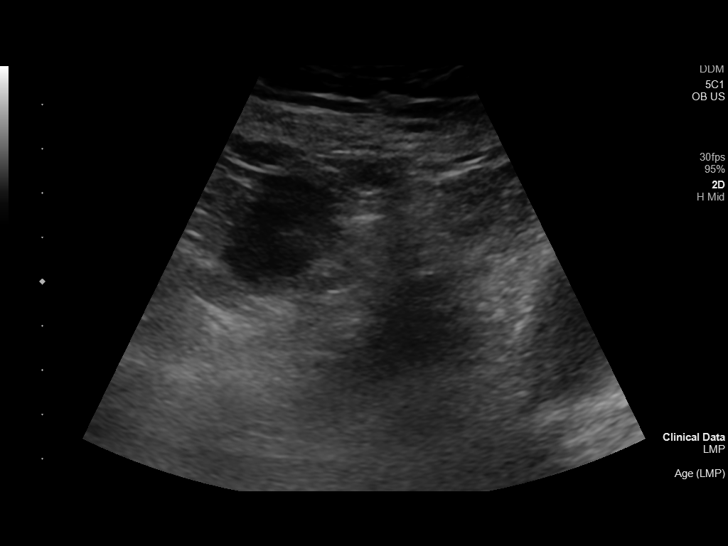
[im 41/156]
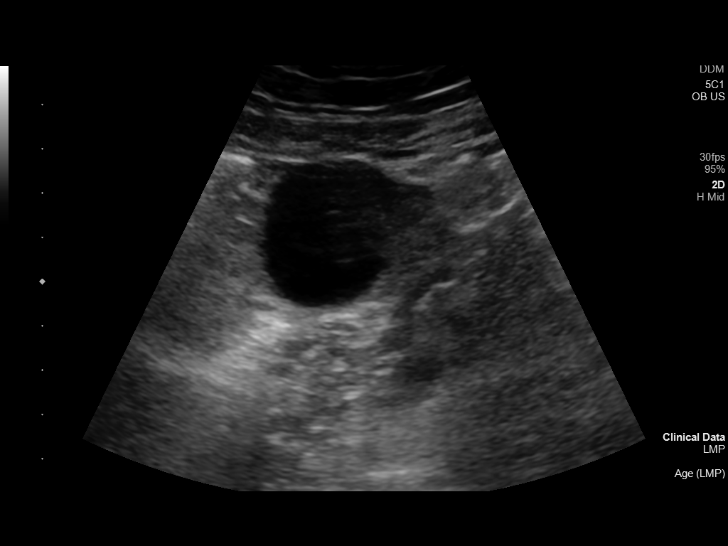
[im 52/156]
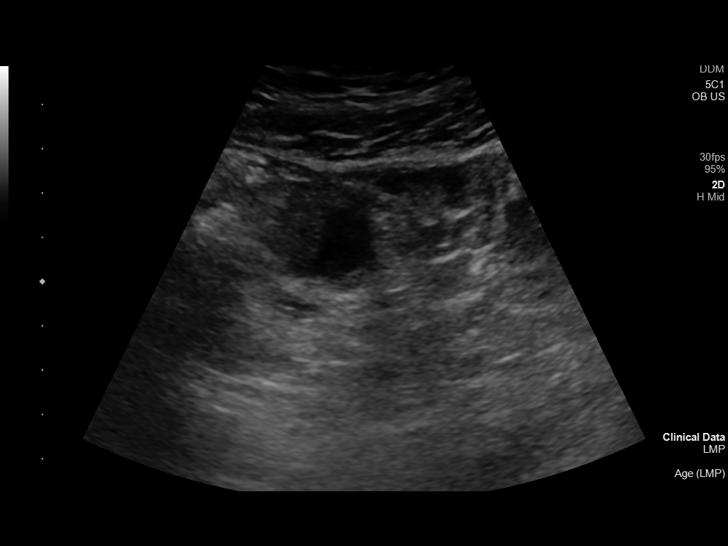
[im 64/156]
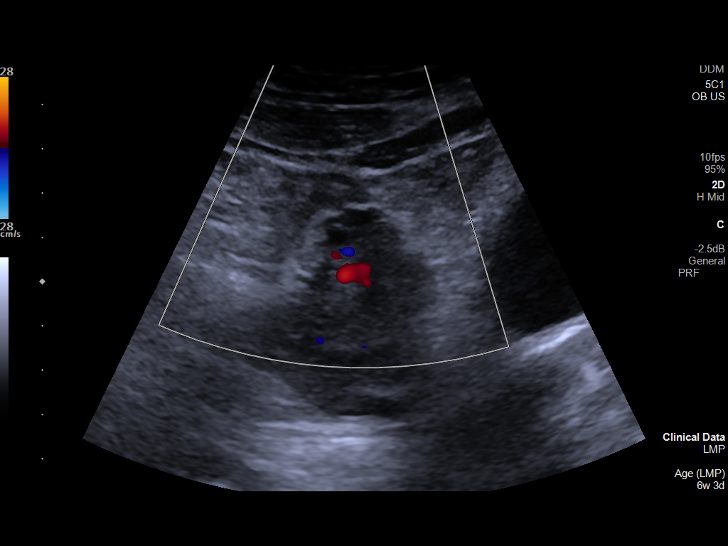
[im 75/156]
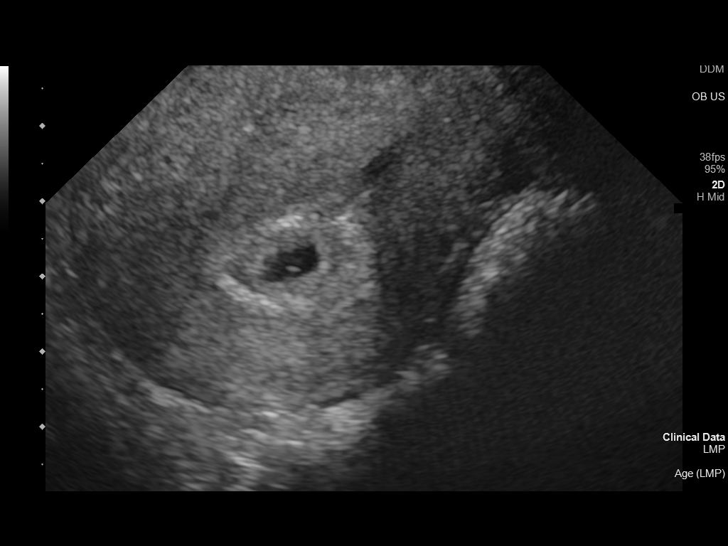
[im 87/156]
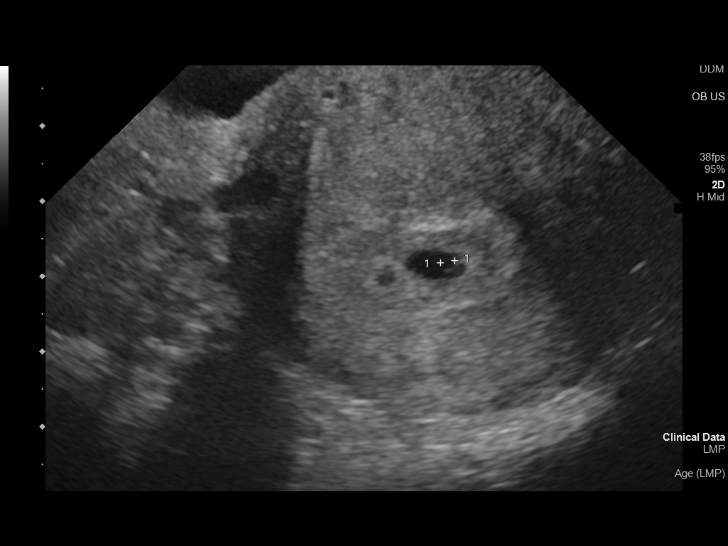
[im 98/156]
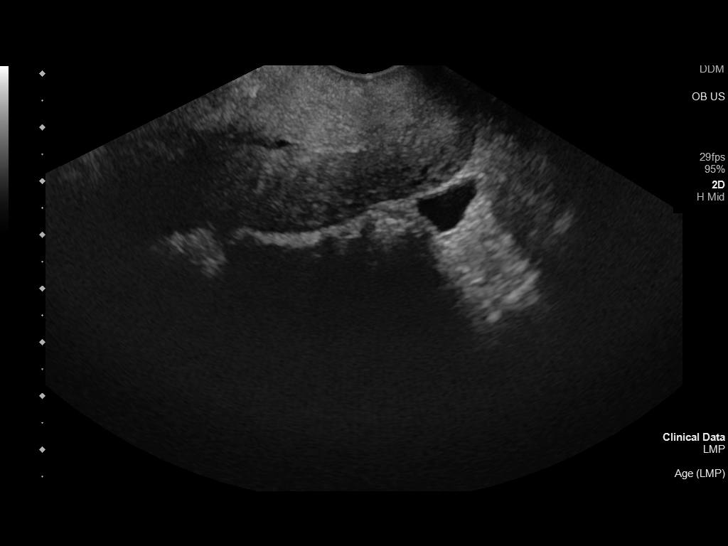
[im 110/156]
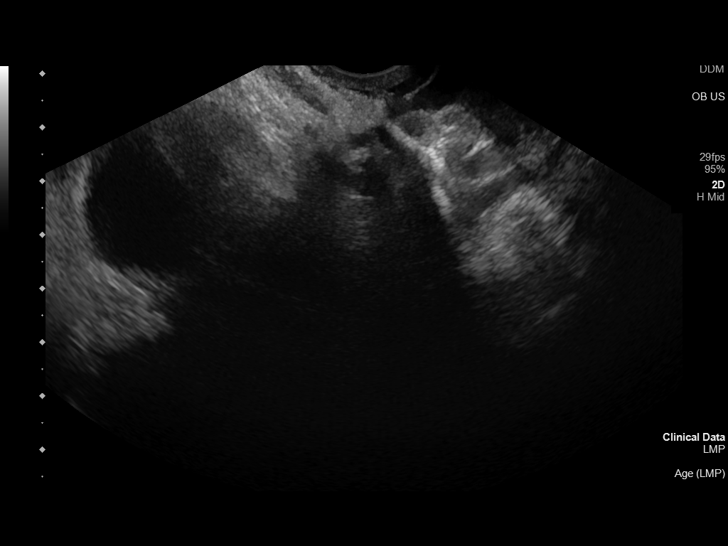
[im 121/156]
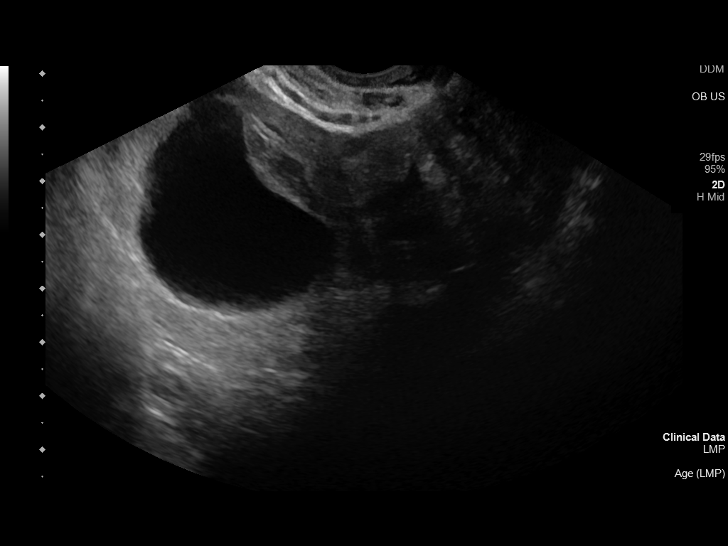
[im 133/156]
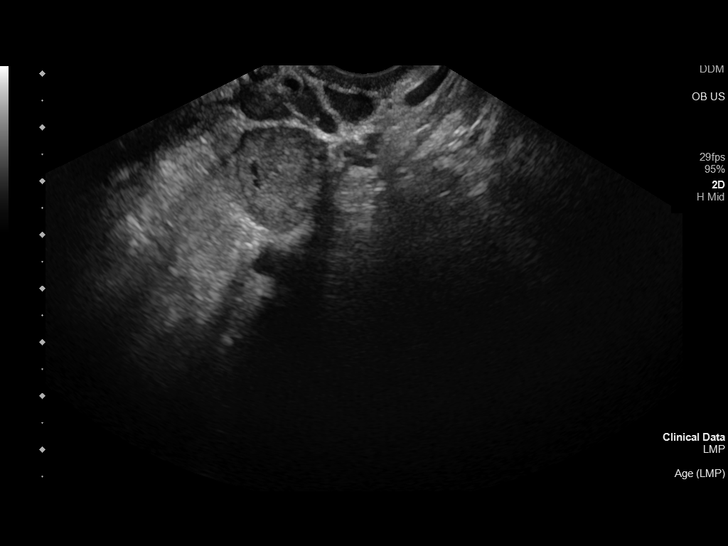
[im 144/156]
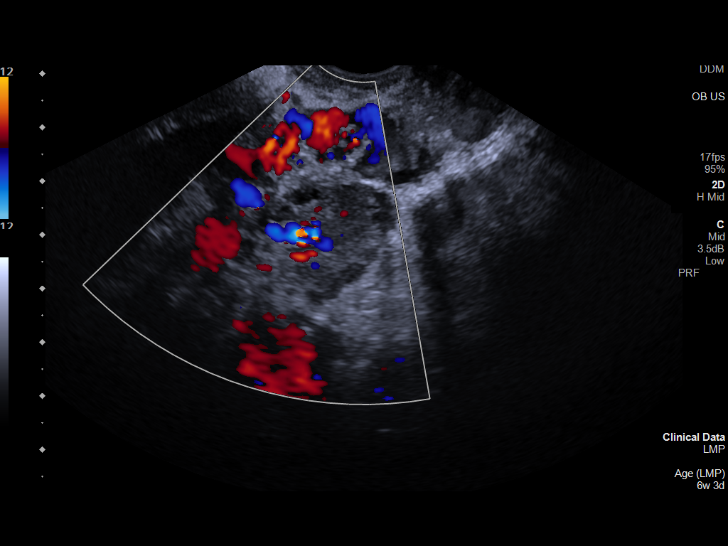
[im 156/156]
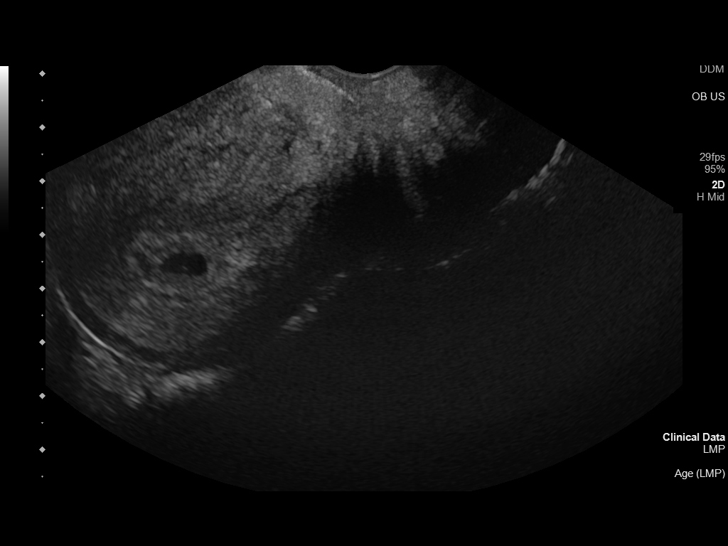

[14 of 28 positions shown; findings below may reference images not displayed]

FINDINGS: Intrauterine gestational sac: Single

Yolk sac:  Visualized.

Embryo:  Not Visualized.

Cardiac Activity: Not Visualized.

MSD: 6.8 mm   5 w   3 d

Subchorionic hemorrhage:  None visualized.

Maternal uterus/adnexae: No uterine masses. Cervix is closed. Simple
appearing 3.8 cm right ovarian cyst. Ovaries and adnexa otherwise
unremarkable. No pelvic free fluid.
IMPRESSION: 1. Findings consistent with an early intrauterine pregnancy with a
gestational sac and yolk sac, but no visible embryo. No pregnancy
complication. Recommend serial beta HCG levels and follow-up
ultrasound in 10-14 days to confirm normal pregnancy progression.

## 2022-11-12 IMAGING — US US OB < 14 WEEKS - US OB TV
1 series · 15 of 28 positions shown · non-contrast
Comparison: Obstetric ultrasound 11/21/2020

CLINICAL DATA: Threatened abortion in first trimester. Spotting
this morning.

EXAM:
OBSTETRIC <14 WK US AND TRANSVAGINAL OB US
TECHNIQUE: Both transabdominal and transvaginal ultrasound examinations were
performed for complete evaluation of the gestation as well as the
maternal uterus, adnexal regions, and pelvic cul-de-sac.
Transvaginal technique was performed to assess early pregnancy.

[Series 1: us ob comp less 14 wks · 15 of 56 slices shown]
[im 1/56]
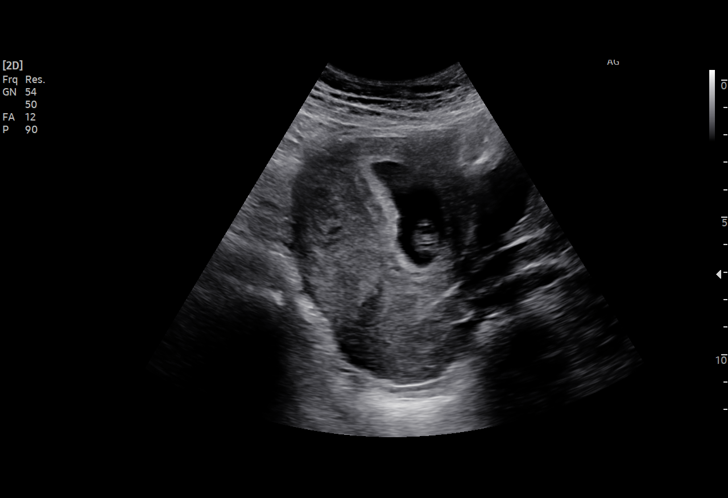
[im 5/56]
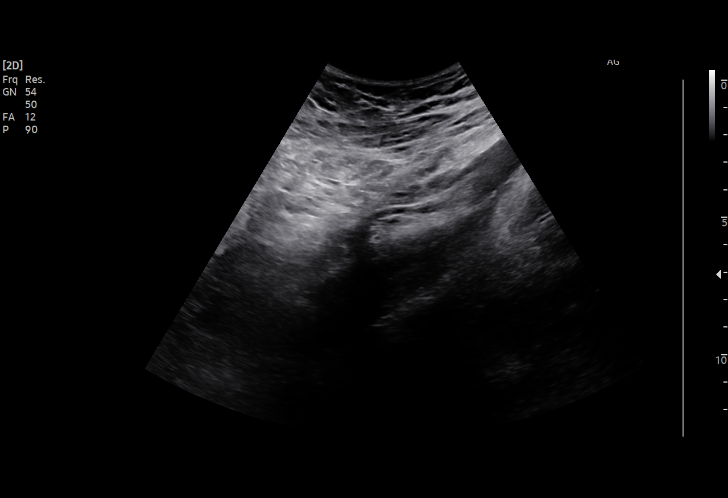
[im 9/56]
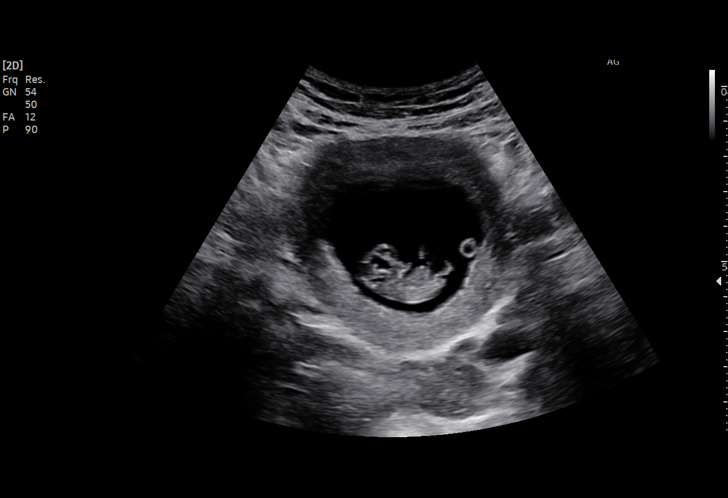
[im 13/56]
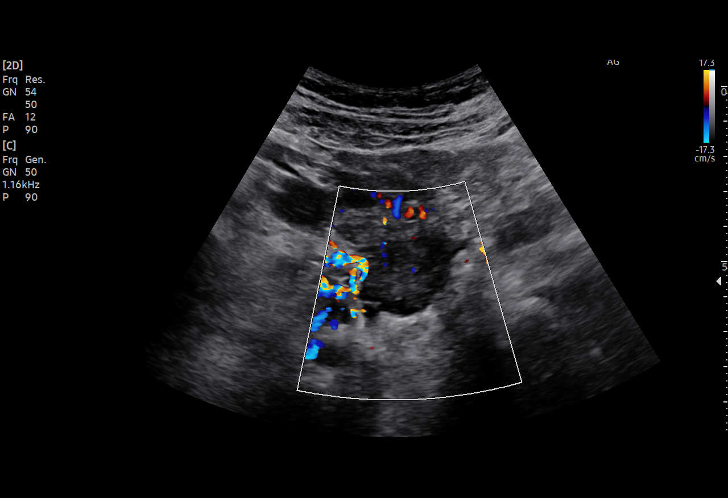
[im 17/56]
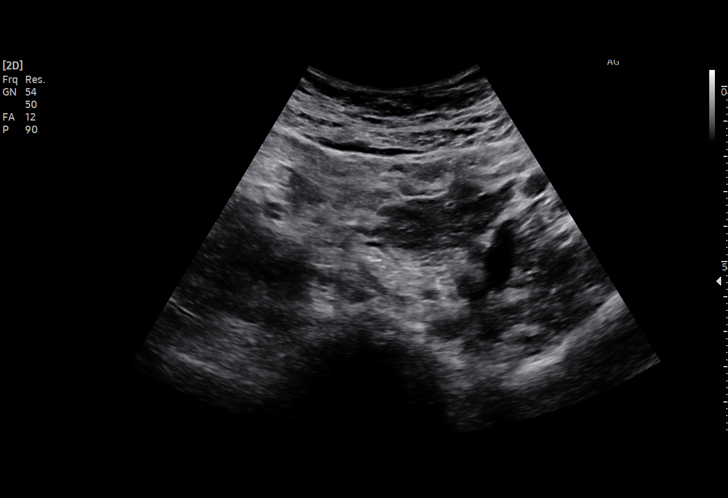
[im 21/56]
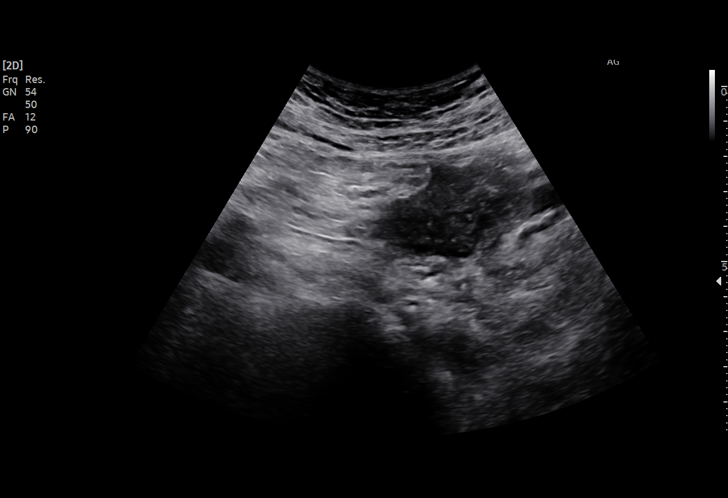
[im 25/56]
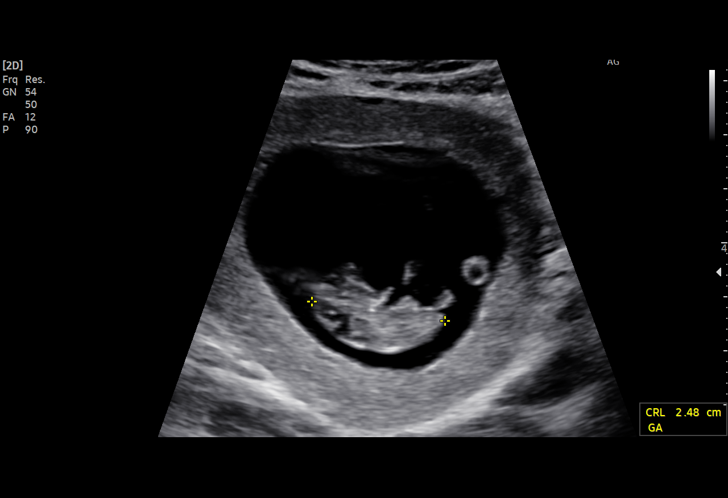
[im 29/56]
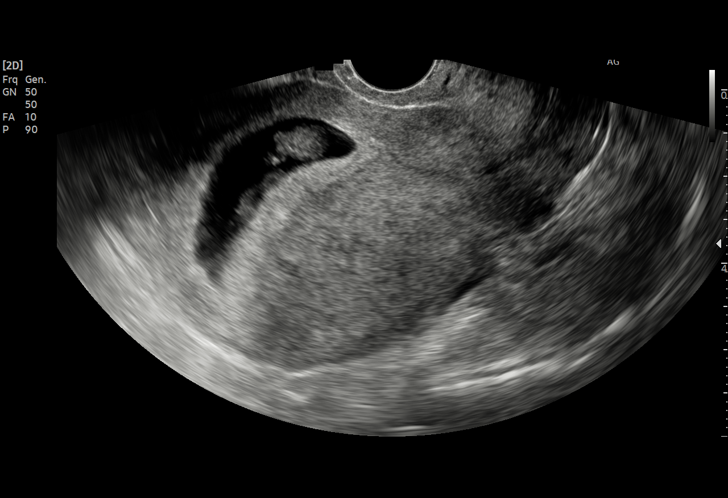
[im 31/56]
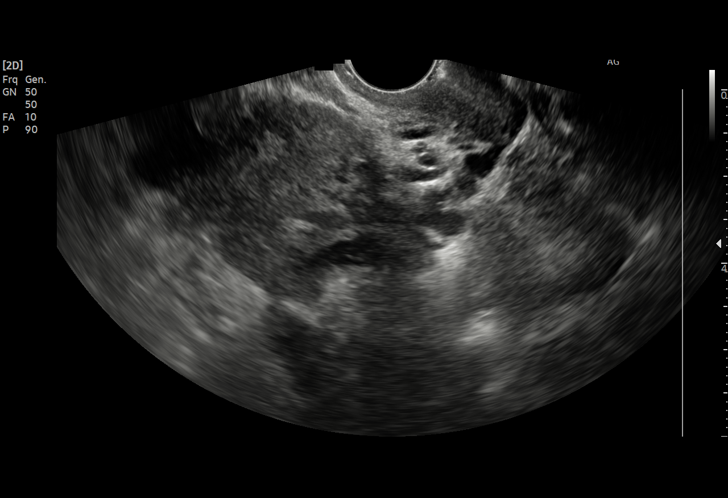
[im 35/56]
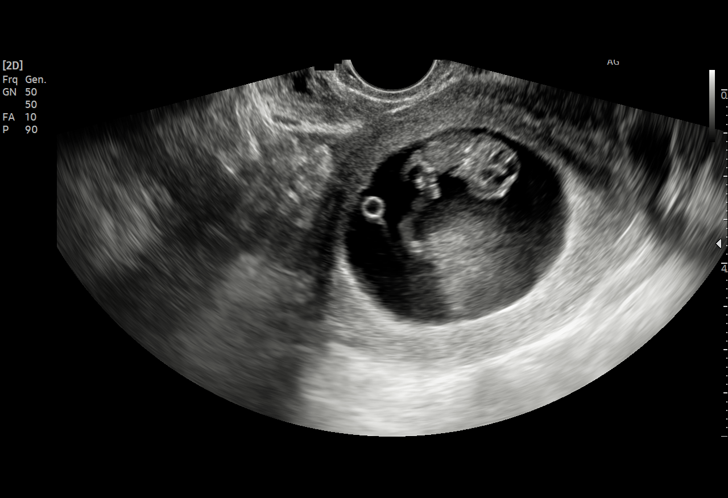
[im 39/56]
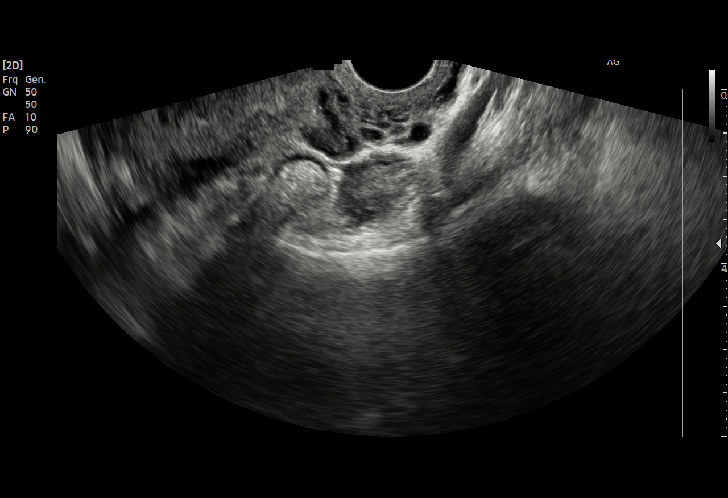
[im 43/56]
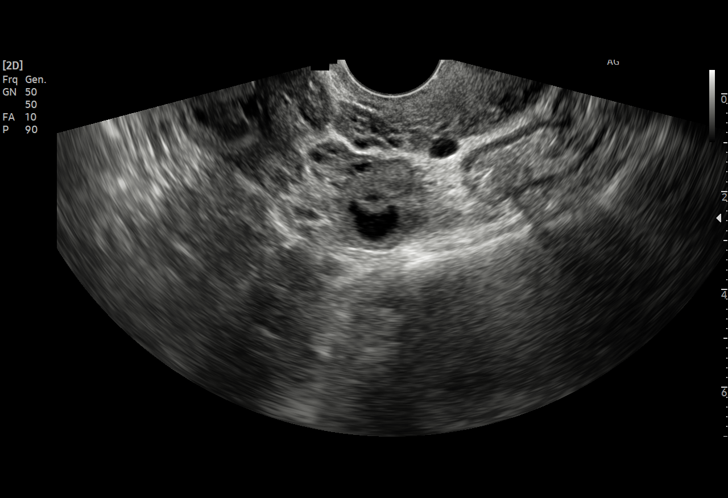
[im 47/56]
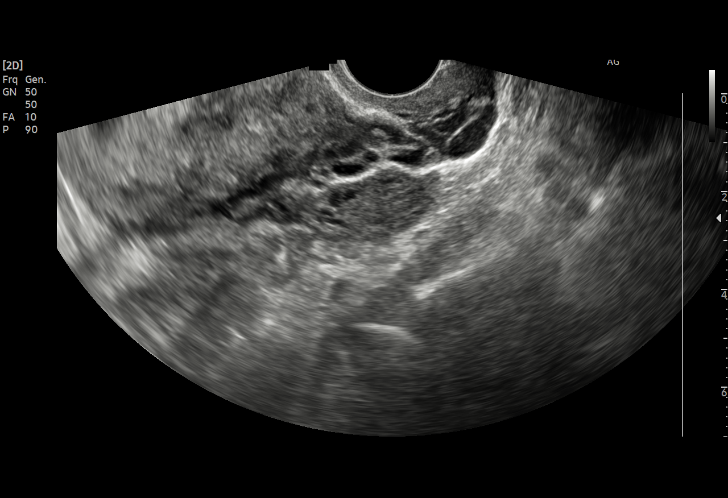
[im 51/56]
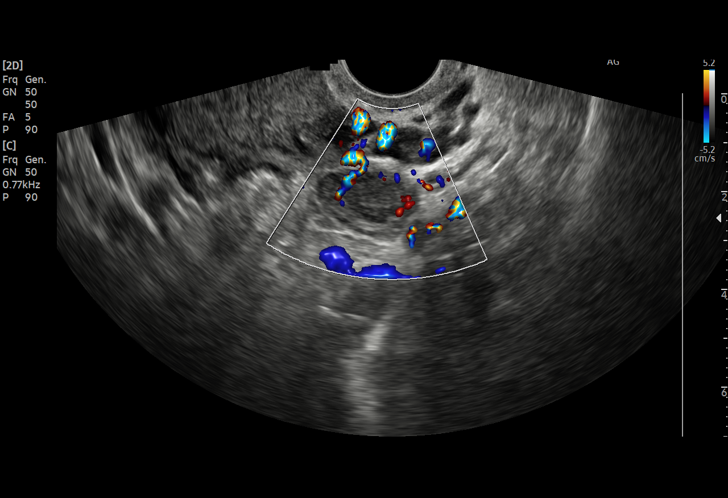
[im 56/56]
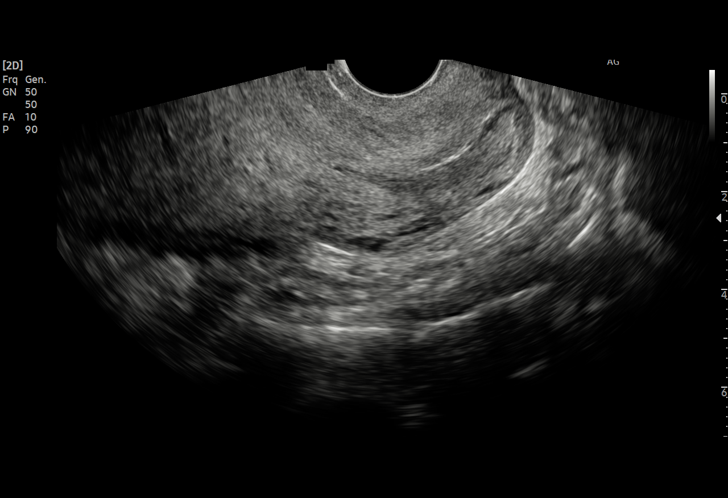

[15 of 28 positions shown; findings below may reference images not displayed]

FINDINGS: Intrauterine gestational sac: Single

Yolk sac:  Visualized.

Embryo:  Visualized.

Cardiac Activity: Visualized.

Heart Rate: 164 bpm

CRL:  26 mm   9 w   2 d                  US EDC: 07/23/2021

Subchorionic hemorrhage:  None visualized.

Maternal uterus/adnexae: Both ovaries are visualized and are normal.
Ovarian blood flow is seen. No pelvic free fluid. No adnexal mass.
IMPRESSION: 1. Single live intrauterine pregnancy estimated gestational age 9
weeks 2 days based on crown-rump length for ultrasound EDC
07/23/2021.
2. No subchorionic hemorrhage.

## 2022-11-20 ENCOUNTER — Ambulatory Visit: Payer: Self-pay | Admitting: Physician Assistant

## 2022-11-20 NOTE — Progress Notes (Deleted)
I,Mcadoo Muzquiz,acting as a Neurosurgeon for [employer], PA-C.,have documented all relevant documentation on the behalf of Alfredia Ferguson, PA-C,as directed by  Alfredia Ferguson, PA-C while in the presence of Alfredia Ferguson, PA-C.   New patient visit   Patient: Veronica Hinton   DOB: September 11, 1990   31 y.o. Female  MRN: [id number] Visit Date: 11/20/2022  Today's healthcare provider: Alfredia Ferguson, PA-C   No chief complaint on file.  Subjective    Rianne Basara is a 32 y.o. female who presents today as a new patient to establish care.  HPI  ***  Past Medical History:  Diagnosis Date   Mental disorder    Thyroid disease    Past Surgical History:  Procedure Laterality Date   EYE SURGERY     TUBAL LIGATION Bilateral 07/15/2021   Procedure: POST PARTUM TUBAL LIGATION;  Surgeon: Schermerhorn, Ihor Austin, MD;  Location: [facility] ORS;  Service: Gynecology;  Laterality: Bilateral;   Family Status  Relation Name Status   Mother  (Not Specified)   Family History  Problem Relation Age of Onset   Bipolar disorder Mother    Schizophrenia Mother    Social History   Socioeconomic History   Marital status: Significant Other    Spouse name: Not on file   Number of children: 3   Years of education: 7   Highest education level: 7th grade  Occupational History   Not on file  Tobacco Use   Smoking status: Every Day    Packs/day: .5    Types: Cigarettes   Smokeless tobacco: Never  Substance and Sexual Activity   Alcohol use: Not Currently    Comment: last use 11 months ago   Drug use: Not Currently    Types: Cocaine, Methamphetamines, Marijuana    Comment: last use 11 months ago   Sexual activity: Yes    Birth control/protection: Surgical    Comment: planning tubal  Other Topics Concern   Not on file  Social History Narrative   Patient and 2 of her children live with her current boyfriend and his 3 children. Patient's 5yo is currently living with their father. Patient reports that  her boyfriend is her only support and she has no other supports in [place].    Social Determinants of Health   Financial Resource Strain: Not on file  Food Insecurity: Not on file  Transportation Needs: Not on file  Physical Activity: Not on file  Stress: Not on file  Social Connections: Not on file   Outpatient Medications Prior to Visit  Medication Sig   acetaminophen (TYLENOL) 325 MG tablet Take 2 tablets (650 mg total) by mouth every 4 (four) hours as needed (for pain scale < 4).   benzocaine-Menthol (DERMOPLAST) 20-0.5 % AERO Apply 1 application topically as needed for irritation (perineal discomfort).   coconut oil OIL Apply 1 application topically as needed.   diphenhydrAMINE (BENADRYL) 25 mg capsule Take 1 capsule (25 mg total) by mouth every 6 (six) hours as needed for itching.   ibuprofen (ADVIL) 600 MG tablet Take 1 tablet (600 mg total) by mouth every 6 (six) hours.   levothyroxine (SYNTHROID) 125 MCG tablet Take 1 tablet (125 mcg total) by mouth daily at 6 (six) AM.   meloxicam (MOBIC) 15 MG tablet Take 1 tablet (15 mg total) by mouth daily.   methocarbamol (ROBAXIN) 500 MG tablet Take 1 tablet (500 mg total) by mouth 4 (four) times daily.   ondansetron (ZOFRAN) 4 MG tablet Take 1 tablet (  4 mg total) by mouth every 8 (eight) hours as needed for nausea.   ondansetron (ZOFRAN-ODT) 4 MG disintegrating tablet Take 1 tablet (4 mg total) by mouth every 6 (six) hours as needed for nausea or vomiting.   penicillin v potassium (VEETID) 500 MG tablet Take 1 tablet (500 mg total) by mouth 4 (four) times daily.   Prenatal Vit-Fe Fumarate-FA (PRENATAL MULTIVITAMIN) TABS tablet Take 1 tablet by mouth daily at 12 noon.   senna-docusate (SENOKOT-S) 8.6-50 MG tablet Take 2 tablets by mouth daily.   simethicone (MYLICON) 80 MG chewable tablet Chew 1 tablet (80 mg total) by mouth as needed for flatulence.   traMADol (ULTRAM) 50 MG tablet Take 1 tablet (50 mg total) by mouth every 6 (six) hours as  needed.   witch hazel-glycerin (TUCKS) pad Apply 1 application topically as needed for hemorrhoids (for pain).   No facility-administered medications prior to visit.   Allergies  Allergen Reactions   Latex Rash    There is no immunization history for the selected administration types on file for this patient.  Health Maintenance  Topic Date Due   COVID-19 Vaccine (1) Never done   Hepatitis C Screening  Never done   DTaP/Tdap/Td (1 - Tdap) Never done   PAP SMEAR-Modifier  Never done   INFLUENZA VACCINE  01/03/2023   HIV Screening  Completed   HPV VACCINES  Aged Out    Patient Care Team: Pcp, No as PCP - General  Review of Systems  {Labs  Heme  Chem  Endocrine  Serology  Results Review (optional):23779}   Objective    There were no vitals taken for this visit. {Show previous Seanna Sisler signs (optional):23777}  Physical Exam ***  Depression Screen    05/10/2021   10:24 AM  PHQ 2/9 Scores  PHQ - 2 Score   PHQ- 9 Score      Information is confidential and restricted. Go to Review Flowsheets to unlock data.   No results found for any visits on 11/20/22.  Assessment & Plan     ***  No follow-ups on file.     {provider attestation***:1}   Alfredia Ferguson, PA-C  [facility] [phone] (phone) [phone] (fax)  [facility]

## 2023-02-13 DIAGNOSIS — Z91040 Latex allergy status: Secondary | ICD-10-CM | POA: Diagnosis not present

## 2023-02-13 DIAGNOSIS — G8929 Other chronic pain: Secondary | ICD-10-CM | POA: Diagnosis not present

## 2023-02-13 DIAGNOSIS — E039 Hypothyroidism, unspecified: Secondary | ICD-10-CM | POA: Diagnosis not present

## 2023-02-13 DIAGNOSIS — Z833 Family history of diabetes mellitus: Secondary | ICD-10-CM | POA: Diagnosis not present

## 2023-02-13 DIAGNOSIS — Z720 Tobacco use: Secondary | ICD-10-CM | POA: Diagnosis not present

## 2023-02-13 DIAGNOSIS — Z6839 Body mass index (BMI) 39.0-39.9, adult: Secondary | ICD-10-CM | POA: Diagnosis not present

## 2023-02-13 DIAGNOSIS — F319 Bipolar disorder, unspecified: Secondary | ICD-10-CM | POA: Diagnosis not present

## 2023-02-13 DIAGNOSIS — E669 Obesity, unspecified: Secondary | ICD-10-CM | POA: Diagnosis not present

## 2023-02-13 DIAGNOSIS — Z5982 Transportation insecurity: Secondary | ICD-10-CM | POA: Diagnosis not present

## 2023-02-13 DIAGNOSIS — Z8249 Family history of ischemic heart disease and other diseases of the circulatory system: Secondary | ICD-10-CM | POA: Diagnosis not present

## 2023-02-13 DIAGNOSIS — J45909 Unspecified asthma, uncomplicated: Secondary | ICD-10-CM | POA: Diagnosis not present

## 2023-02-13 DIAGNOSIS — R32 Unspecified urinary incontinence: Secondary | ICD-10-CM | POA: Diagnosis not present

## 2023-02-28 ENCOUNTER — Emergency Department
Admission: EM | Admit: 2023-02-28 | Discharge: 2023-02-28 | Disposition: A | Discharge status: Home / Self Care | Payer: 59 | Attending: Emergency Medicine | Admitting: Emergency Medicine

## 2023-02-28 ENCOUNTER — Other Ambulatory Visit: Payer: Self-pay

## 2023-02-28 ENCOUNTER — Encounter: Payer: Self-pay | Admitting: *Deleted

## 2023-02-28 DIAGNOSIS — E039 Hypothyroidism, unspecified: Secondary | ICD-10-CM | POA: Insufficient documentation

## 2023-02-28 DIAGNOSIS — Z760 Encounter for issue of repeat prescription: Secondary | ICD-10-CM | POA: Insufficient documentation

## 2023-02-28 MED ORDER — LEVOTHYROXINE SODIUM 125 MCG PO TABS: 125.0000 ug | ORAL_TABLET | Freq: Every day | ORAL | 0 refills | Status: AC

## 2023-02-28 NOTE — ED Notes (Signed)
Pt is here for refill, EDP saw her in triage and was able to d/c her.  Pt was very happy about this.

## 2023-02-28 NOTE — ED Provider Notes (Signed)
   [facility] Provider Note    Event Date/Time   First MD Initiated Contact with Patient 02/28/23 [phone]     (approximate)   History   Thyroid Problem   HPI  Veronica Hinton is a 32 y.o. female with a history of hypothyroidism who typically takes Synthroid, she reports she is out of her medication.  She does not have anyone to refill it at this time.  Is pending Medicaid transition to the state.  She is concerned that her thyroid function is worsening     Physical Exam   Triage Vital Signs: ED Triage Vitals  Encounter Vitals Group     BP 02/28/23 1913 (!) 138/91     Systolic BP Percentile --      Diastolic BP Percentile --      Pulse Rate 02/28/23 1913 73     Resp 02/28/23 1913 (!) 22     Temp 02/28/23 1913 98.7 F (37.1 C)     Temp Source 02/28/23 1913 Oral     SpO2 02/28/23 1913 97 %     Weight --      Height --      Head Circumference --      Peak Flow --      Pain Score 02/28/23 1913 0     Pain Loc --      Pain Education --      Exclude from Growth Chart --     Most recent vital signs: Vitals:   02/28/23 1913  BP: (!) 138/91  Pulse: 73  Resp: (!) 22  Temp: 98.7 F (37.1 C)  SpO2: 97%     General: Awake, no distress.  CV:  Good peripheral perfusion.  Resp:  Normal effort.  Abd:  No distention.  Other:     ED Results / Procedures / Treatments   Labs (all labs ordered are listed, but only abnormal results are displayed) Labs Reviewed - No data to display   EKG     RADIOLOGY     PROCEDURES:  Critical Care performed:   Procedures   MEDICATIONS ORDERED IN ED: Medications - No data to display   IMPRESSION / MDM / ASSESSMENT AND PLAN / ED COURSE  I reviewed the triage vital signs and the nursing notes. Patient's presentation is most consistent with exacerbation of chronic illness.  Patient with long history of hypothyroidism, she takes 125 mcg of Synthroid, she is very well-appearing and in no acute  distress, we will refill her medication, PCP referral made        FINAL CLINICAL IMPRESSION(S) / ED DIAGNOSES   Final diagnoses:  Medication refill     Rx / DC Orders   ED Discharge Orders          Ordered    levothyroxine (SYNTHROID) 125 MCG tablet  Daily        02/28/23 1917    Ambulatory Referral to Primary Care (Establish Care)        02/28/23 1917             Note:  This document was prepared using Dragon voice recognition software and may include unintentional dictation errors.   Jene Every, MD 02/28/23 2031

## 2023-02-28 NOTE — ED Triage Notes (Signed)
Pt with hypothyroidism has been out of her medication for about a month.  She states that she would like her level checked and a refill of the medication.  Pt has been feeling dizzy since early this am.  No focal neuro deficits.

## 2023-04-03 ENCOUNTER — Telehealth: Payer: Self-pay

## 2023-04-03 NOTE — Telephone Encounter (Signed)
Transition Care Management Follow-up Telephone Call Date of discharge and from where: 02/28/2023 [facility] How have you been since you were released from the hospital? Patient will follow-up with Medicaid and get a PCP and a specialist for her thyroid condition. Any questions or concerns? No  Items Reviewed: Did the pt receive and understand the discharge instructions provided? Yes  Medications obtained and verified? Yes  Other? No  Any new allergies since your discharge? No  Dietary orders reviewed? Yes Do you have support at home? Yes   Follow up appointments reviewed:  PCP Hospital f/u appt confirmed? Patient will follow-up with Medicaid and get a PCP and a specialist for her thyroid condition. Patient stated her Medicaid has been approved. Scheduled to see  on  @ . Specialist Hospital f/u appt confirmed? No  Scheduled to see  on  @ . Are transportation arrangements needed? No  If their condition worsens, is the pt aware to call PCP or go to the Emergency Dept.? Yes Was the patient provided with contact information for the PCP's office or ED? Yes Was to pt encouraged to call back with questions or concerns? Yes   Orma Cheetham [facility]  [facility] Guide Direct Dial: [phone]  Website: [email or web address]

## 2023-04-11 DIAGNOSIS — Z13220 Encounter for screening for lipoid disorders: Secondary | ICD-10-CM | POA: Diagnosis not present

## 2023-04-11 DIAGNOSIS — E039 Hypothyroidism, unspecified: Secondary | ICD-10-CM | POA: Diagnosis not present

## 2023-04-11 DIAGNOSIS — F418 Other specified anxiety disorders: Secondary | ICD-10-CM | POA: Diagnosis not present

## 2023-04-11 DIAGNOSIS — Z1389 Encounter for screening for other disorder: Secondary | ICD-10-CM | POA: Diagnosis not present

## 2023-04-11 DIAGNOSIS — Z9189 Other specified personal risk factors, not elsewhere classified: Secondary | ICD-10-CM | POA: Diagnosis not present

## 2023-04-11 DIAGNOSIS — Z1159 Encounter for screening for other viral diseases: Secondary | ICD-10-CM | POA: Diagnosis not present

## 2023-04-11 DIAGNOSIS — R519 Headache, unspecified: Secondary | ICD-10-CM | POA: Diagnosis not present

## 2023-04-11 DIAGNOSIS — K0889 Other specified disorders of teeth and supporting structures: Secondary | ICD-10-CM | POA: Diagnosis not present

## 2023-04-11 DIAGNOSIS — Z8659 Personal history of other mental and behavioral disorders: Secondary | ICD-10-CM | POA: Diagnosis not present

## 2023-04-11 DIAGNOSIS — R002 Palpitations: Secondary | ICD-10-CM | POA: Diagnosis not present

## 2023-04-11 DIAGNOSIS — Z131 Encounter for screening for diabetes mellitus: Secondary | ICD-10-CM | POA: Diagnosis not present
